# Patient Record
Sex: Male | Born: 1940 | Race: Black or African American | Hispanic: No | Marital: Single | Smoking: Never smoker
Health system: Southern US, Community
[De-identification: ages and names within clinical notes are randomized; demographics above are authoritative.]

## PROBLEM LIST (undated history)

## (undated) DIAGNOSIS — I2510 Atherosclerotic heart disease of native coronary artery without angina pectoris: Secondary | ICD-10-CM

## (undated) DIAGNOSIS — Z8546 Personal history of malignant neoplasm of prostate: Secondary | ICD-10-CM

## (undated) DIAGNOSIS — K219 Gastro-esophageal reflux disease without esophagitis: Secondary | ICD-10-CM

## (undated) DIAGNOSIS — I10 Essential (primary) hypertension: Secondary | ICD-10-CM

## (undated) DIAGNOSIS — G459 Transient cerebral ischemic attack, unspecified: Secondary | ICD-10-CM

## (undated) DIAGNOSIS — R29898 Other symptoms and signs involving the musculoskeletal system: Secondary | ICD-10-CM

## (undated) DIAGNOSIS — E119 Type 2 diabetes mellitus without complications: Secondary | ICD-10-CM

## (undated) DIAGNOSIS — E785 Hyperlipidemia, unspecified: Secondary | ICD-10-CM

## (undated) DIAGNOSIS — C20 Malignant neoplasm of rectum: Secondary | ICD-10-CM

## (undated) DIAGNOSIS — I6529 Occlusion and stenosis of unspecified carotid artery: Secondary | ICD-10-CM

## (undated) DIAGNOSIS — Z972 Presence of dental prosthetic device (complete) (partial): Secondary | ICD-10-CM

## (undated) DIAGNOSIS — J3489 Other specified disorders of nose and nasal sinuses: Secondary | ICD-10-CM

## (undated) DIAGNOSIS — M1990 Unspecified osteoarthritis, unspecified site: Secondary | ICD-10-CM

## (undated) DIAGNOSIS — M419 Scoliosis, unspecified: Secondary | ICD-10-CM

## (undated) HISTORY — PX: PROSTATE BIOPSY: SHX241

## (undated) HISTORY — DX: Gastro-esophageal reflux disease without esophagitis: K21.9

## (undated) HISTORY — DX: Occlusion and stenosis of unspecified carotid artery: I65.29

## (undated) HISTORY — PX: SHOULDER SURGERY: SHX246

## (undated) HISTORY — DX: Unspecified osteoarthritis, unspecified site: M19.90

## (undated) HISTORY — DX: Type 2 diabetes mellitus without complications: E11.9

## (undated) HISTORY — PX: CARDIAC CATHETERIZATION: SHX172

## (undated) HISTORY — DX: Atherosclerotic heart disease of native coronary artery without angina pectoris: I25.10

## (undated) HISTORY — DX: Essential (primary) hypertension: I10

## (undated) HISTORY — PX: INGUINAL HERNIA REPAIR: SUR1180

## (undated) HISTORY — DX: Hyperlipidemia, unspecified: E78.5

## (undated) HISTORY — DX: Malignant neoplasm of rectum: C20

## (undated) HISTORY — DX: Personal history of malignant neoplasm of prostate: Z85.46

---

## 1999-06-14 DIAGNOSIS — C20 Malignant neoplasm of rectum: Secondary | ICD-10-CM

## 1999-06-14 HISTORY — DX: Malignant neoplasm of rectum: C20

## 2003-05-27 ENCOUNTER — Other Ambulatory Visit: Payer: Self-pay

## 2003-06-01 ENCOUNTER — Other Ambulatory Visit: Payer: Self-pay

## 2003-08-14 ENCOUNTER — Other Ambulatory Visit: Payer: Self-pay

## 2004-01-09 ENCOUNTER — Other Ambulatory Visit: Payer: Self-pay

## 2004-05-13 ENCOUNTER — Other Ambulatory Visit: Payer: Self-pay

## 2004-05-13 ENCOUNTER — Emergency Department: Payer: Self-pay | Admitting: Emergency Medicine

## 2004-09-14 ENCOUNTER — Ambulatory Visit: Payer: Self-pay | Admitting: General Surgery

## 2004-11-06 ENCOUNTER — Emergency Department: Payer: Self-pay | Admitting: Internal Medicine

## 2004-11-06 ENCOUNTER — Other Ambulatory Visit: Payer: Self-pay

## 2005-01-24 ENCOUNTER — Emergency Department: Payer: Self-pay | Admitting: Unknown Physician Specialty

## 2005-01-24 ENCOUNTER — Other Ambulatory Visit: Payer: Self-pay

## 2005-03-23 ENCOUNTER — Other Ambulatory Visit: Payer: Self-pay

## 2005-03-23 ENCOUNTER — Emergency Department: Payer: Self-pay | Admitting: Emergency Medicine

## 2005-03-30 ENCOUNTER — Ambulatory Visit: Payer: Self-pay | Admitting: Unknown Physician Specialty

## 2005-10-02 ENCOUNTER — Emergency Department: Payer: Self-pay | Admitting: General Practice

## 2007-06-06 ENCOUNTER — Emergency Department: Payer: Self-pay | Admitting: Emergency Medicine

## 2008-01-07 ENCOUNTER — Emergency Department: Payer: Self-pay | Admitting: Emergency Medicine

## 2008-05-12 ENCOUNTER — Emergency Department: Payer: Self-pay | Admitting: Emergency Medicine

## 2008-11-27 ENCOUNTER — Inpatient Hospital Stay: Payer: Self-pay | Admitting: Internal Medicine

## 2008-12-02 ENCOUNTER — Emergency Department: Payer: Self-pay | Admitting: Emergency Medicine

## 2008-12-07 ENCOUNTER — Emergency Department: Payer: Self-pay | Admitting: Emergency Medicine

## 2009-09-29 ENCOUNTER — Ambulatory Visit: Payer: Self-pay | Admitting: General Surgery

## 2009-09-29 HISTORY — PX: COLONOSCOPY: SHX174

## 2011-01-02 ENCOUNTER — Emergency Department: Payer: Self-pay | Admitting: Emergency Medicine

## 2012-03-26 ENCOUNTER — Encounter: Payer: Self-pay | Admitting: Orthopedic Surgery

## 2012-04-13 ENCOUNTER — Encounter: Payer: Self-pay | Admitting: Orthopedic Surgery

## 2012-06-13 HISTORY — PX: CORONARY ARTERY BYPASS GRAFT: SHX141

## 2012-08-02 ENCOUNTER — Observation Stay: Payer: Self-pay | Admitting: Internal Medicine

## 2012-08-02 LAB — COMPREHENSIVE METABOLIC PANEL
Albumin: 4.2 g/dL (ref 3.4–5.0)
Alkaline Phosphatase: 118 U/L (ref 50–136)
Anion Gap: 9 (ref 7–16)
BUN: 11 mg/dL (ref 7–18)
Bilirubin,Total: 0.6 mg/dL (ref 0.2–1.0)
Calcium, Total: 9 mg/dL (ref 8.5–10.1)
Chloride: 101 mmol/L (ref 98–107)
Co2: 26 mmol/L (ref 21–32)
Creatinine: 0.78 mg/dL (ref 0.60–1.30)
Glucose: 222 mg/dL — ABNORMAL HIGH (ref 65–99)
Osmolality: 278 (ref 275–301)
SGOT(AST): 23 U/L (ref 15–37)
SGPT (ALT): 24 U/L (ref 12–78)

## 2012-08-02 LAB — CBC
HCT: 40.3 % (ref 40.0–52.0)
HGB: 13.4 g/dL (ref 13.0–18.0)
Platelet: 151 10*3/uL (ref 150–440)

## 2012-08-02 LAB — CK TOTAL AND CKMB (NOT AT ARMC)
CK, Total: 135 U/L (ref 35–232)
CK, Total: 114 U/L (ref 35–232)
CK, Total: 112 U/L (ref 35–232)
CK-MB: 0.7 ng/mL (ref 0.5–3.6)
CK-MB: <0.5 ng/mL — ABNORMAL LOW (ref 0.5–3.6)

## 2012-08-02 LAB — LIPID PANEL
HDL Cholesterol: 61 mg/dL — ABNORMAL HIGH (ref 40–60)
Triglycerides: 56 mg/dL (ref 0–200)
VLDL Cholesterol, Calc: 11 mg/dL (ref 5–40)

## 2012-08-02 LAB — TROPONIN I: Troponin-I: <0.02 ng/mL

## 2012-08-02 LAB — HEMOGLOBIN A1C: Hemoglobin A1C: 7.1 % — ABNORMAL HIGH (ref 4.2–6.3)

## 2012-08-03 DIAGNOSIS — I209 Angina pectoris, unspecified: Secondary | ICD-10-CM

## 2012-08-03 LAB — BASIC METABOLIC PANEL
Anion Gap: 6 — ABNORMAL LOW (ref 7–16)
Calcium, Total: 8.8 mg/dL (ref 8.5–10.1)
Chloride: 104 mmol/L (ref 98–107)
Creatinine: 0.82 mg/dL (ref 0.60–1.30)
EGFR (Non-African Amer.): >60
Osmolality: 276 (ref 275–301)
Sodium: 136 mmol/L (ref 136–145)

## 2012-08-03 LAB — CBC WITH DIFFERENTIAL/PLATELET
Eosinophil %: 0.5 %
HCT: 39.5 % — ABNORMAL LOW (ref 40.0–52.0)
Lymphocyte #: 0.9 10*3/uL — ABNORMAL LOW (ref 1.0–3.6)
Lymphocyte %: 23 %
MCHC: 33.2 g/dL (ref 32.0–36.0)
MCV: 93 fL (ref 80–100)
Neutrophil #: 2.6 10*3/uL (ref 1.4–6.5)
Neutrophil %: 64.9 %
Platelet: 160 10*3/uL (ref 150–440)
WBC: 3.9 10*3/uL (ref 3.8–10.6)

## 2012-08-06 ENCOUNTER — Encounter: Payer: Self-pay | Admitting: *Deleted

## 2012-08-06 ENCOUNTER — Telehealth: Payer: Self-pay

## 2012-08-06 NOTE — Telephone Encounter (Signed)
TCM call Pt denies CP, sob or back pain Confirms compliance with meds per d/c instructions Confirms knowledge of appt with [place] 2/26 Feels well, except for "allergy problems" for which he is taking meds for

## 2012-08-06 NOTE — Telephone Encounter (Signed)
TCM  

## 2012-08-06 NOTE — Telephone Encounter (Signed)
Message copied by [facility], Cerita Rabelo E on Mon Aug 06, 2012  9:52 AM ------      Message from: Iverson Alamin C      Created: Mon Aug 06, 2012  9:40 AM       Tcm      [facility] discharge: 08/03/12      Appoint: Brion Aliment 08/08/12      Records printed. ------

## 2012-08-08 ENCOUNTER — Ambulatory Visit (INDEPENDENT_AMBULATORY_CARE_PROVIDER_SITE_OTHER): Payer: Medicare PPO | Admitting: Nurse Practitioner

## 2012-08-08 ENCOUNTER — Encounter: Payer: Self-pay | Admitting: Nurse Practitioner

## 2012-08-08 VITALS — BP 112/70 | HR 83 | Ht 76.5 in | Wt 181.5 lb

## 2012-08-08 DIAGNOSIS — R000 Tachycardia, unspecified: Secondary | ICD-10-CM

## 2012-08-08 DIAGNOSIS — E785 Hyperlipidemia, unspecified: Secondary | ICD-10-CM | POA: Insufficient documentation

## 2012-08-08 DIAGNOSIS — I658 Occlusion and stenosis of other precerebral arteries: Secondary | ICD-10-CM

## 2012-08-08 DIAGNOSIS — I6523 Occlusion and stenosis of bilateral carotid arteries: Secondary | ICD-10-CM

## 2012-08-08 DIAGNOSIS — I6529 Occlusion and stenosis of unspecified carotid artery: Secondary | ICD-10-CM | POA: Insufficient documentation

## 2012-08-08 DIAGNOSIS — I10 Essential (primary) hypertension: Secondary | ICD-10-CM | POA: Insufficient documentation

## 2012-08-08 DIAGNOSIS — E119 Type 2 diabetes mellitus without complications: Secondary | ICD-10-CM

## 2012-08-08 DIAGNOSIS — I2510 Atherosclerotic heart disease of native coronary artery without angina pectoris: Secondary | ICD-10-CM

## 2012-08-08 NOTE — Progress Notes (Signed)
Patient Name: Jeremy Braun Date of Encounter: 08/08/2012  Primary Care Provider:  Yetta Flock, MD Primary Cardiologist:  Concha Se, MD  Patient Profile  72 year old male with prior history of CAD who was recently seen in the hospital for chest pain and had a negative Myoview presents for followup.  Problem List   Past Medical History  Diagnosis Date  . Coronary artery disease     a. s/p CABG;  b. 07/2012 Myoview sm region of mildly dec perfusion in the basal to mid inf wall w/o signif ischemia.  EF 60%.  . Hypertension   . Hyperlipidemia   . DM II (diabetes mellitus, type II), controlled   . History of prostate cancer   . Carotid stenosis     a. 07/2012 U/S: < 50% bilat stenosis - Followed by Dr. Wyn Quaker.   Past Surgical History  Procedure Laterality Date  . Shoulder surgery Left   . Inguinal hernia repair    . Coronary artery bypass graft      [facility]  . Prostate biopsy      Allergies  No Known Allergies  HPI  72 year old male with the above problem list. He was recently admitted to [facility] with complaints of chest discomfort. CT angiography chest was negative for dissection. He ruled out for MI. He was seen by cardiology and underwent Myoview which was negative. He was subsequently discharged home. Since discharge, he has had no chest pain, dyspnea, PND, orthopnea, syncope, or edema. He did feel a little dizzy after awakening and standing this morning but this resolved by sitting down. He does not routinely check his blood pressure at home. Is not routinely exercising.  Home Medications  Prior to Admission medications   Medication Sig Start Date End Date Taking? Authorizing Provider  amLODipine (NORVASC) 10 MG tablet Take 10 mg by mouth daily.   Yes Historical Provider, MD  aspirin 81 MG tablet Take 81 mg by mouth daily.   Yes Historical Provider, MD  benazepril (LOTENSIN) 40 MG tablet Take 40 mg by mouth 2 (two) times daily.   Yes Historical Provider, MD    clopidogrel (PLAVIX) 75 MG tablet Take 75 mg by mouth daily.   Yes Historical Provider, MD  Cyanocobalamin (VITAMIN B-12 IJ) Inject as directed every 30 (thirty) days.   Yes Historical Provider, MD  fluticasone (FLONASE) 50 MCG/ACT nasal spray Place 2 sprays into the nose daily.  06/28/12  Yes Historical Provider, MD  glipiZIDE (GLUCOTROL) 10 MG tablet Take 10 mg by mouth 2 (two) times daily before a meal.   Yes Historical Provider, MD  insulin glargine (LANTUS) 100 UNIT/ML injection Inject 11 Units into the skin at bedtime.   Yes Historical Provider, MD  isosorbide mononitrate (IMDUR) 30 MG 24 hr tablet Take 30 mg by mouth daily.   Yes Historical Provider, MD  lovastatin (MEVACOR) 40 MG tablet Take 40 mg by mouth at bedtime.   Yes Historical Provider, MD  metFORMIN (GLUCOPHAGE) 1000 MG tablet Takes 1000 mg am and 1500 mg pm daily.   Yes Historical Provider, MD  pioglitazone (ACTOS) 45 MG tablet Take 45 mg by mouth daily.   Yes Historical Provider, MD    Review of Systems  As above, he had some orthostasis this morning but otherwise has been doing well.  He denies chest pain, palpitations, dyspnea, pnd, orthopnea, n, v, dizziness, syncope, edema, weight gain, or early satiety. All other systems reviewed and are otherwise negative except as noted above.  Physical Exam  Blood pressure 112/70, pulse 83, height 6' 4.5" (1.943 m), weight 181 lb 8 oz (82.328 kg).  General: Pleasant, NAD Psych: Normal affect. Neuro: Alert and oriented X 3. Moves all extremities spontaneously. HEENT: Normal  Neck: Supple without bruits or JVD. Lungs:  Resp regular and unlabored, CTA. Heart: RRR no s3, s4, or murmurs. Abdomen: Soft, non-tender, non-distended, BS + x 4.  Extremities: No clubbing, cyanosis or edema. DP/PT/Radials 2+ and equal bilaterally.  Accessory Clinical Findings  ECG - regular sinus rhythm, 81, no acute ST or T changes.  Assessment & Plan  1.  Chest pain/coronary artery disease: Status  post recent admission and rule out. Myoview was negative during admission and he has had no recurrence of chest pain. He remains on aspirin, statin, and long-acting nitrate therapy. He is also on chronic Plavix.  Nitrate was initiated during hospitalization and prior to stress testing. Patient wishes to stop this if not necessary. In the absence of ischemia I recommended he could discontinue and see how he feels.  2. Hypertension: Stable. He reports signs of mild orthostasis this morning. I recommended he change positions more slowly prior to getting up.  3. Hyperlipidemia: Continue statin therapy.  4. Diabetes mellitus: Per primary care.  5. Carotid arterial disease: Status post recent ultrasound with less than 50% bilateral stenosis. This is been followed by vascular surgery in the past.  6. Disposition: Followup in 3-6 months.    Nicolasa Ducking, NP 08/08/2012, 4:32 PM

## 2012-08-08 NOTE — Patient Instructions (Addendum)
No changes to your medications were made today.  Continue medications  F/u with Dr. Mariah Milling in 3 months.

## 2012-09-19 ENCOUNTER — Ambulatory Visit: Payer: Self-pay | Admitting: Ophthalmology

## 2012-10-19 ENCOUNTER — Emergency Department: Payer: Self-pay | Admitting: Unknown Physician Specialty

## 2012-10-19 LAB — CBC
HGB: 13 g/dL (ref 13.0–18.0)
MCH: 30.5 pg (ref 26.0–34.0)
MCHC: 33.4 g/dL (ref 32.0–36.0)
MCV: 91 fL (ref 80–100)
Platelet: 133 10*3/uL — ABNORMAL LOW (ref 150–440)
RDW: 12.3 % (ref 11.5–14.5)

## 2012-10-19 LAB — COMPREHENSIVE METABOLIC PANEL
Alkaline Phosphatase: 113 U/L (ref 50–136)
Anion Gap: 6 — ABNORMAL LOW (ref 7–16)
BUN: 14 mg/dL (ref 7–18)
Bilirubin,Total: 0.6 mg/dL (ref 0.2–1.0)
Calcium, Total: 9.1 mg/dL (ref 8.5–10.1)
Chloride: 101 mmol/L (ref 98–107)
Co2: 27 mmol/L (ref 21–32)
Creatinine: 0.96 mg/dL (ref 0.60–1.30)
EGFR (Non-African Amer.): >60
Glucose: 214 mg/dL — ABNORMAL HIGH (ref 65–99)
SGOT(AST): 23 U/L (ref 15–37)
SGPT (ALT): 20 U/L (ref 12–78)

## 2012-10-19 LAB — TROPONIN I: Troponin-I: <0.02 ng/mL

## 2012-11-06 ENCOUNTER — Ambulatory Visit: Payer: Self-pay | Admitting: Cardiovascular Disease

## 2012-12-21 ENCOUNTER — Encounter: Payer: Self-pay | Admitting: *Deleted

## 2013-06-13 HISTORY — PX: EYE SURGERY: SHX253

## 2014-07-02 DIAGNOSIS — E538 Deficiency of other specified B group vitamins: Secondary | ICD-10-CM | POA: Diagnosis not present

## 2014-07-15 DIAGNOSIS — I10 Essential (primary) hypertension: Secondary | ICD-10-CM | POA: Diagnosis not present

## 2014-07-15 DIAGNOSIS — D510 Vitamin B12 deficiency anemia due to intrinsic factor deficiency: Secondary | ICD-10-CM | POA: Diagnosis not present

## 2014-07-15 DIAGNOSIS — E780 Pure hypercholesterolemia: Secondary | ICD-10-CM | POA: Diagnosis not present

## 2014-07-15 DIAGNOSIS — E538 Deficiency of other specified B group vitamins: Secondary | ICD-10-CM | POA: Diagnosis not present

## 2014-07-15 DIAGNOSIS — E119 Type 2 diabetes mellitus without complications: Secondary | ICD-10-CM | POA: Diagnosis not present

## 2014-07-22 DIAGNOSIS — E119 Type 2 diabetes mellitus without complications: Secondary | ICD-10-CM | POA: Diagnosis not present

## 2014-07-22 DIAGNOSIS — I10 Essential (primary) hypertension: Secondary | ICD-10-CM | POA: Diagnosis not present

## 2014-07-22 DIAGNOSIS — E784 Other hyperlipidemia: Secondary | ICD-10-CM | POA: Diagnosis not present

## 2014-07-22 DIAGNOSIS — I2510 Atherosclerotic heart disease of native coronary artery without angina pectoris: Secondary | ICD-10-CM | POA: Diagnosis not present

## 2014-08-04 DIAGNOSIS — E538 Deficiency of other specified B group vitamins: Secondary | ICD-10-CM | POA: Diagnosis not present

## 2014-08-15 DIAGNOSIS — E11329 Type 2 diabetes mellitus with mild nonproliferative diabetic retinopathy without macular edema: Secondary | ICD-10-CM | POA: Diagnosis not present

## 2014-09-04 DIAGNOSIS — D510 Vitamin B12 deficiency anemia due to intrinsic factor deficiency: Secondary | ICD-10-CM | POA: Diagnosis not present

## 2014-09-19 DIAGNOSIS — H02403 Unspecified ptosis of bilateral eyelids: Secondary | ICD-10-CM | POA: Diagnosis not present

## 2014-09-25 DIAGNOSIS — H02403 Unspecified ptosis of bilateral eyelids: Secondary | ICD-10-CM | POA: Diagnosis not present

## 2014-09-30 ENCOUNTER — Encounter: Payer: Self-pay | Admitting: General Surgery

## 2014-09-30 ENCOUNTER — Ambulatory Visit (INDEPENDENT_AMBULATORY_CARE_PROVIDER_SITE_OTHER): Payer: Commercial Managed Care - HMO | Admitting: General Surgery

## 2014-09-30 VITALS — BP 122/74 | HR 70 | Resp 12 | Ht 77.0 in | Wt 167.0 lb

## 2014-09-30 DIAGNOSIS — Z1211 Encounter for screening for malignant neoplasm of colon: Secondary | ICD-10-CM | POA: Insufficient documentation

## 2014-09-30 NOTE — Patient Instructions (Signed)
Colonoscopy  A colonoscopy is an exam to look at the entire large intestine (colon). This exam can help find problems such as tumors, polyps, inflammation, and areas of bleeding. The exam takes about 1 hour.   LET YOUR HEALTH CARE PROVIDER KNOW ABOUT:   · Any allergies you have.  · All medicines you are taking, including vitamins, herbs, eye drops, creams, and over-the-counter medicines.  · Previous problems you or members of your family have had with the use of anesthetics.  · Any blood disorders you have.  · Previous surgeries you have had.  · Medical conditions you have.  RISKS AND COMPLICATIONS   Generally, this is a safe procedure. However, as with any procedure, complications can occur. Possible complications include:  · Bleeding.  · Tearing or rupture of the colon wall.  · Reaction to medicines given during the exam.  · Infection (rare).  BEFORE THE PROCEDURE   · Ask your health care provider about changing or stopping your regular medicines.  · You may be prescribed an oral bowel prep. This involves drinking a large amount of medicated liquid, starting the day before your procedure. The liquid will cause you to have multiple loose stools until your stool is almost clear or light green. This cleans out your colon in preparation for the procedure.  · Do not eat or drink anything else once you have started the bowel prep, unless your health care provider tells you it is safe to do so.  · Arrange for someone to drive you home after the procedure.  PROCEDURE   · You will be given medicine to help you relax (sedative).  · You will lie on your side with your knees bent.  · A long, flexible tube with a light and camera on the end (colonoscope) will be inserted through the rectum and into the colon. The camera sends video back to a computer screen as it moves through the colon. The colonoscope also releases carbon dioxide gas to inflate the colon. This helps your health care provider see the area better.  · During  the exam, your health care provider may take a small tissue sample (biopsy) to be examined under a microscope if any abnormalities are found.  · The exam is finished when the entire colon has been viewed.  AFTER THE PROCEDURE   · Do not drive for 24 hours after the exam.  · You may have a small amount of blood in your stool.  · You may pass moderate amounts of gas and have mild abdominal cramping or bloating. This is caused by the gas used to inflate your colon during the exam.  · Ask when your test results will be ready and how you will get your results. Make sure you get your test results.  Document Released: 05/27/2000 Document Revised: 03/20/2013 Document Reviewed: 02/04/2013  Exit[employer]® Patient Information ©2015 [employer]. This information is not intended to replace advice given to you by your health care provider. Make sure you discuss any questions you have with your health care provider.

## 2014-09-30 NOTE — Progress Notes (Signed)
Patient ID: Jeremy Braun, male   DOB: Oct 14, 1940, 74 y.o.   MRN: [id number]  Chief Complaint  Patient presents with  . Other    colonoscopy    HPI Jeremy Braun is a 74 y.o. male here today for a evaluation of a colonoscopy. Patient states no Gi problems at this time. Last colonoscopy was performed on 09/29/2009.  The patient had transanal excision of rectal polyp in 2001. HPI  Past Medical History  Diagnosis Date  . Coronary artery disease     a. s/p CABG;  b. 07/2012 Myoview sm region of mildly dec perfusion in the basal to mid inf wall w/o signif ischemia.  EF 60%.  . Hypertension   . Hyperlipidemia   . DM II (diabetes mellitus, type II), controlled   . History of prostate cancer   . Carotid stenosis     a. 07/2012 U/S: < 50% bilat stenosis - Followed by Dr. Lucky Braun.  . Arthritis   . GERD (gastroesophageal reflux disease)   . Rectal cancer 2001    Local resection.    Past Surgical History  Procedure Laterality Date  . Shoulder surgery Left   . Inguinal hernia repair    . Prostate biopsy    . Colonoscopy  09/29/2009  . Eye surgery Right 2015    Inplant, Dr Jeremy Braun  . Coronary artery bypass graft  2014    [facility]    Family History  Problem Relation Age of Onset  . Heart disease Mother   . Heart disease Brother     Social History History  Substance Use Topics  . Smoking status: Never Smoker   . Smokeless tobacco: Not on file  . Alcohol Use: No    No Known Allergies  Current Outpatient Prescriptions  Medication Sig Dispense Refill  . amLODipine (NORVASC) 10 MG tablet Take 10 mg by mouth daily.    Marland Kitchen aspirin 81 MG tablet Take 81 mg by mouth daily.    . benazepril (LOTENSIN) 40 MG tablet Take 40 mg by mouth 2 (two) times daily.    . clopidogrel (PLAVIX) 75 MG tablet Take 75 mg by mouth daily.    . Cyanocobalamin (VITAMIN B-12 IJ) Inject as directed every 30 (thirty) days.    . Cyanocobalamin (VITAMIN B-12) 5000 MCG LOZG Take 1 tablet by mouth daily.    .  finasteride (PROSCAR) 5 MG tablet Take 5 mg by mouth daily.    Marland Kitchen glipiZIDE (GLUCOTROL) 10 MG tablet Take 10 mg by mouth 2 (two) times daily before a meal.    . insulin glargine (LANTUS) 100 UNIT/ML injection Inject 11 Units into the skin at bedtime.    . isosorbide mononitrate (IMDUR) 30 MG 24 hr tablet Take 30 mg by mouth daily.    Marland Kitchen lovastatin (MEVACOR) 40 MG tablet Take 40 mg by mouth at bedtime.    . meloxicam (MOBIC) 7.5 MG tablet Take by mouth.    . metFORMIN (GLUCOPHAGE) 1000 MG tablet Takes 1000 mg am and 1500 mg pm daily.    . pioglitazone (ACTOS) 45 MG tablet Take 45 mg by mouth daily.    . Vitamin D, Cholecalciferol, 1000 UNITS CAPS Take 1 capsule by mouth daily.     No current facility-administered medications for this visit.    Review of Systems Review of Systems  Constitutional: Negative.   Respiratory: Negative.   Cardiovascular: Negative.   Gastrointestinal: Negative.     Blood pressure 122/74, pulse 70, resp. rate 12, height 6\' 5"  (  1.956 m), weight 167 lb (75.751 kg).  Physical Exam Physical Exam  Constitutional: He is oriented to person, place, and time. He appears well-developed and well-nourished.  Eyes: Conjunctivae are normal. No scleral icterus.  Neck: Neck supple.  Cardiovascular: Normal rate, regular rhythm and normal heart sounds.   Pulmonary/Chest: Effort normal and breath sounds normal.  Lymphadenopathy:    He has no cervical adenopathy.  Neurological: He is alert and oriented to person, place, and time.  Skin: Skin is warm and dry.    Data Reviewed 2011 colonoscopy was normal.  Laboratory studies dated 07/15/2014 showed a hemoglobin A1c of 7.1.  Creatinine 0.9 with an estimated GFR 100. Normal electrolytes. Normal liver function studies.  CBC of the same date showed a hemoglobin of 13.2 with an MCV of 94, white blood cell count 4600. Platelet count 175,000.    Assessment    Past history: malignant polyp of the rectum.  Insulin-dependent  diabetes mellitus. Recently well controlled.  Previous TIA/CABG 2014.    Plan    Colonoscopy with possible biopsy/polypectomy prn: Information regarding the procedure, including its potential risks and complications (including but not limited to perforation of the bowel, which may require emergency surgery to repair, and bleeding) was verbally given to the patient. Educational information regarding lower instestinal endoscopy was given to the patient. Written instructions for how to complete the bowel prep using Miralax were provided. The importance of drinking ample fluids to avoid dehydration as a result of the prep emphasized.   The patient will need to discontinue his Plavix 1 week prior to the procedure. We will have him hold his metformin and Glucotrol prior to the procedure. He will continue on his present Lantus and Actos doses.      PCP: Dr Jeremy Braun   Jeremy Braun 09/30/2014, 8:49 PM

## 2014-10-01 ENCOUNTER — Telehealth: Payer: Self-pay | Admitting: *Deleted

## 2014-10-01 DIAGNOSIS — Z1211 Encounter for screening for malignant neoplasm of colon: Secondary | ICD-10-CM

## 2014-10-01 MED ORDER — POLYETHYLENE GLYCOL 3350 17 GM/SCOOP PO POWD: ORAL | Status: AC

## 2014-10-01 NOTE — Telephone Encounter (Signed)
-----   Message from [name], MD sent at 09/30/2014  8:54 PM EDT ----- The patient should hold his glipizide and metformin the day of prep and procedure. He should take his normal dose of Actos and Lantus. Hold Plavix for 1 week.

## 2014-10-01 NOTE — Telephone Encounter (Signed)
Patient has been scheduled for a colonoscopy on 11-26-14 at [facility].   Miralax prescription sent to patient's pharmacy.   Patient will stop by this afternoon to go over instructions since he will be in the area.

## 2014-10-03 DIAGNOSIS — H6982 Other specified disorders of Eustachian tube, left ear: Secondary | ICD-10-CM | POA: Diagnosis not present

## 2014-10-03 NOTE — H&P (Signed)
PATIENT NAME:  Jeremy Braun, Jeremy Braun MR#:  [id number] DATE OF BIRTH:  08-Sep-1940  DATE OF ADMISSION:  08/02/2012  ADMITTING PHYSICIAN: Gladstone Lighter, MD  PRIMARY CARE PHYSICIAN: Kem Kays, MD  CHIEF COMPLAINT: Back pain.   HISTORY OF PRESENT ILLNESS: Mr. Ram is a 74 year old African American male with past medical history significant for coronary artery disease status post bypass graft surgery, diabetes and hypertension who presents to the hospital with the above-mentioned complaint. The patient said he woke up early yesterday morning with pain between his shoulder blades and radiating to his chest and also felt some chest tightness which almost stayed for more than an hour. It was not associated with any nausea, difficulty breathing or chest pain. His bypass surgery was more than 20 years ago and has been doing fine cardiac point wise.  Has not had a stress test in the past 5 years. He did have a CT of chest which did not show any acute findings like PE or dissection so he is being admitted for anginal equivalent and also to rule out acute coronary syndrome.   PAST MEDICAL HISTORY: 1.  Coronary artery disease status post bypass graft surgery about 20 years ago at [facility].   2.  Hypertension.  3.  Hyperlipidemia. 4.  Insulin-dependent diabetes mellitus.   PAST SURGICAL HISTORY: 1.  Coronary artery bypass graft surgery. 2.  Left shoulder repair. 3.  Inguinal hernia repair.   ALLERGIES TO MEDICATIONS: No known drug allergies.   CURRENT HOME MEDICATIONS:  1.   Actos 45 mg p.o. daily. 2.   Norvasc 10 mg p.o. daily. 3.   Aspirin 81 mg p.o. daily.  4.   Benazepril 20 mg p.o. daily. 5.   Glipizide 10 mg p.o. b.i.d.   6.   Lantus 8 units sub-Q daily.  7.   Lovastatin 40 mg p.o. daily.  8.   Metformin 1000 mg in the morning and 1500 mg in the evening. 9.   Plavix 75 mg p.o. daily.   SOCIAL HISTORY: Lives at home with his friend. No history of any current or prior smoking. No alcohol  use.   FAMILY HISTORY: Brother with heart disease and mom with heart disease and bypass graft surgery.  REVIEW OF SYSTEMS: CONSTITUTIONAL: No fever, fatigue or weakness.  EYES: Positive for blurred vision and premature cataracts. No glaucoma or inflammation.  ENT: No tinnitus, ear pain, epistaxis or discharge.  RESPIRATORY: Positive trach cuff for more than a month. No wheeze or COPD.  CARDIOVASCULAR:  Positive for some chest pressure.  No orthopnea, edema, palpitations or syncope. GENITOURINARY: No nausea, vomiting, diarrhea, abdominal pain, hematemesis or melena.  GENITOURINARY:  No dysuria, hematuria, renal calculus, frequency or incontinence.  ENDOCRINE:  No polyuria, nocturia, thyroid problems, heat or cold intolerance. HEMATOLOGIC:  No anemia, easy bruising or bleeding. SKIN: No acne, rash or lesions. MUSCULOSKELETAL: Positive for pain between upper shoulder blades. No arthritis or gout.  NEUROLOGIC:  No numbness, weakness, CVA, TIA or seizures.  PSYCH: No anxiety, insomnia or depression.  PHYSICAL EXAMINATION:  VITAL SIGNS: Temperature 97.8 degrees Fahrenheit, pulse 101, respirations 22, blood pressure 240/101, pulse ox 99% on room air. GENERAL: Well-built, well-nourished male lying in bed, not in any acute distress. HEENT:  Normocephalic, atraumatic. Pupils equal, round and reacting to light. Anicteric sclerae. Extraocular movements intact.  Oropharynx clear without erythema, mass or exudates.  NECK:  Supple. No thyromegaly, JVD or carotid bruits. No lymphadenopathy. LUNGS:  Moving air bilaterally. No wheeze or crackles. No  use of accessory muscles for breathing.  CARDIOVASCULAR: S1 and S2 regular rate and rhythm. 3/6 systolic murmur present in the precordial area. No rubs or gallops. ABDOMEN:  Soft and nontender. Nondistended. No hepatosplenomegaly. Normal bowel sounds. EXTREMITIES: No pedal edema. No clubbing or cyanosis. 2+ dorsalis pedis pulses palpable bilaterally.  SKIN: No  acne, rash or lesions.  LYMPHATICS: No cervical lymphadenopathy.  NEUROLOGIC: Cranial nerves intact. No focal motor or sensory deficits.  PSYCH: The patient is awake, alert and oriented x 3.   LAB DATA: WBC 4.3, hemoglobin 13.4, hematocrit 40.3, platelet count 151.   Sodium 136, potassium 3.8, chloride 101, bicarbonate 26, BUN 11, creatinine 0.79, glucose 222 and calcium of 9.0.   ALT 24, AST 23, alk phos 118, total bilirubin 0.6 and albumin of 4.2. First set of cardiac enzymes are negative. CK 135, CK-MB 0.7, troponin less than 0.02. Second set: CK 114, CK-MB less than 0.5, troponin less than 0.02. HbA1c elevated at 7.1. LDL 66, triglycerides 56, total cholesterol 138 with HDL of 61.  DIAGNOSTICS:  Chest x-ray on admission showing clear lungs. No acute cardiopulmonary disease. CT of the chest with contrast showing dilatation of the aorta proximally without any dissection. Coronary artery atherosclerotic disease is present with postoperative changes. No thoracic aortic dissection or pulmonary embolic filling defect is present. EKG: Normal sinus rhythm. Heart rate of 81.    ASSESSMENT AND PLAN: A 74 year old male with past medical history of hypertension, diabetes and coronary artery disease admitted for back pain and possible angina equivalent. 1.  Anginal equivalent.  Admit to telemetry under observation. Cycle cardiac enzymes. Will get Myoview in the morning. Start on aspirin, nitroglycerin and other medical management.  2.  Malignant hypertension/accelerated hypertension. Start on IV hydralazine p.r.n. and also continue his home medications.  3.  Diabetes mellitus. Slightly elevated HbA1c.  Continue sliding scale insulin and also home medications.  4.   Hyperlipemia. On statin.  5.  History of carotid artery stenosis.  Not sure which side he had blockage, so we will get carotid artery Dopplers while he is in the hospital.  6.  GI and DVT prophylaxis with ranitidine and also Lovenox.   CODE  STATUS: FULL CODE.   TIME SPENT ON ADMISSION: 50 minutes. ____________________________ Gladstone Lighter, MD rk:sb D: 08/02/2012 14:22:28 ET T: 08/02/2012 14:49:51 ET JOB#: [id number]  cc: Gladstone Lighter, MD, <Dictator> Lorenza Evangelist, MD Gladstone Lighter MD ELECTRONICALLY SIGNED 08/08/2012 16:00

## 2014-10-03 NOTE — Discharge Summary (Signed)
PATIENT NAME:  DANI, DANIS MR#:  [id number] DATE OF BIRTH:  [birth year]  DATE OF ADMISSION:  08/02/2012 DATE OF DISCHARGE:  08/03/2012  ADMITTING DIAGNOSIS: Back pain concerning for possible angina.  DISCHARGE DIAGNOSES: 1. Unstable angina with chest as well as back pain. 2. Hypertension. 3. Hyperlipidemia with LDL well controlled at 66.  4. Diabetes mellitus, insulin-dependent, hemoglobin A1c is 7.1.   DISCHARGE CONDITION: Stable.   DISCHARGE MEDICATIONS: The patient is to resume his outpatient medications, which are:  1. Amlodipine 10 mg p.o. daily. 2. Plavix 75 mg p.o. daily. 3. Actos 45 mg p.o. daily. 4. Aspirin 81 mg p.o. daily. 5. Lovastatin 40 p.o. daily.  6. Benazepril 40 mg p.o. twice daily. This is advancement of the patient's dose.  7.  Isosorbide mononitrate 30 mg p.o. daily. This is a new medication.  8. Metformin 1 gram 1 tablet in the morning and 1.5 tablets in the evening to be started on Sunday, which is 08/05/2012.  9. Insulin glargine 11 units subcutaneously at bedtime. This is new dose, up from 8 units.  10. The patient is also to continue her glipizide 10 mg p.o. twice daily dose.   HOME OXYGEN: None.   DIET: 2 grams salt, low fat, low cholesterol, carbohydrate diet, regular consistency.   ACTIVITY LIMITATIONS: As tolerated.   FOLLOWUP APPOINTMENT: With Dr. Kem Kays in 2 days after discharge and Dr. Rockey Situ in one week after discharge.   CONSULTANTS: Care Management, Dr. Rockey Situ.   RADIOLOGIC STUDIES: Chest PA and lateral 08/02/2012, showed no acute cardiopulmonary disease. CT scan of the chest with contrast 08/02/2012, revealed that the rotation of the aorta proximally without dissection. Coronary artery atherosclerotic disease was present with postoperative changes. There is no thoracic aortic dissection or pulmonary embolic filling defect. Carotid ultrasound, bilateral, 08/02/2012, revealed no sonographic evidence of hemodynamically significant  stenosis within the right or left carotid systems. Myoview stress test 08/03/2012, revealed pharmacological myocardial perfusion imaging study with no significant ischemia, a small region of mildly decreased perfusion in the basal to mid inferior wall seen on both stress and rest images, unable to exclude a small region of old infarct, a small region of the wall motion abnormality noted in this area. Normal ejection fraction estimated at 60%. No significant EKG changes concerning for ischemia. Overall this is a low-risk scan according to cardiologist.  HOSPITAL COURSE: The patient is a 74 year old male with past medical history significant for a history of hypertension, coronary artery disease, hyperlipidemia, as well as diabetes mellitus, who presented to the hospital with complaints of back pain. Please refer to Dr. Michelene Heady admission note done 08/02/2012. Apparently, he was complaining about back pain as well as anterior chest tightness, lasting for  approximately one hour. On arrival to the Emergency Room, the patient's temperature was 97.8, the pulse was 101, respiration rate was 22, blood pressure 240/101, pulse oximetry was 99% on room air. Physical exam was unremarkable. The patient's EKG showed normal sinus rhythm at 81 beats, no acute ST changes were noted. The patient's lab data done in the Emergency Room initially on the 08/02/2012, showed glucose of 222, otherwise BMP was unremarkable. The patient's liver enzymes were normal. Cardiac enzymes x3 were within normal limits. The patient's CBC showed a white blood cell count of 4.3, hemoglobin was 13.4, platelet count 151. The patient's chest x-ray was unremarkable. The patient was admitted to the hospital for further evaluation. His cardiac enzymes were cycled, and the consultation with cardiologist was obtained. The patient  also was recommended cardiac stress test. A stress test was performed on 08/03/2012 and it was negative for inducible ischemia.  It was felt that the patient has a low-risk scan; however, it was also felt that the patient is at risk of worsening condition and if recurrent pains occur, the patient may benefit from a cardiac catheterization. For this reason, the patient is being discharged home with the above-mentioned medications adding isosorbide mononitrate as well as followup with cardiologist, Dr. Rockey Situ, in the next few days after discharge.   In regards to poorly controled hypertension, the patient's blood pressure medications were advanced to current levels, and the patient's blood pressure was much better controlled. By the day of discharge, 08/03/2012, the patient's temperature was 98.4, pulse of 70, respiratory rate was 16, blood pressure ranging from 152 to 166, oxygen saturation was 97% on room air at rest. It was felt that the patient should have very close follow up with Cardiology, as well as his primary care physician, Dr. Kem Kays, and make decisions about advancement of his blood pressure medications if needed.   For hyperlipidemia, the patient's lipid panel was checked and the patient's LDL was found to be well controlled at 66. The patient's total cholesterol level was 138, triglycerides were 56 and HDL was 61. It was felt that the patient's lipids are well controlled. The patient is to continue his outpatient medication doses.   For diabetes mellitus, the patient's hemoglobin A1c was checked and it was found to be 7.1. While in the hospital, the patient was noted to significant morning hyperglycemia with blood glucose levels at around 170s. Lantus was advanced. The patient is to continue all other diabetic medications. It is recommended to advance the patient's medications even more if needed.   By the day of discharge, the patient notes at this time he did not complain of any significant discomfort. He is being discharged in stable condition with the above-mentioned medications and followup. He is to resume  his outpatient medications. He had his blood pressure medications  advanced. He is to hold metformin until Sunday, 08/05/2012, since he received IV dye for a CT scan of his chest. He is, however, to resume all other diabetic medications and his Lantus was slightly advanced.  For hyperlipidemia, the patient is to continue lovastatin as well as low fat, low cholesterol diet. The patient is being discharged in stable condition with the above-mentioned medications and followup.   TIME SPENT:  40 minutes.  ____________________________ Theodoro Grist, MD rv:jm D: 08/03/2012 18:49:39 ET T: 08/04/2012 11:22:44 ET JOB#: [id number]  cc: Theodoro Grist, MD, <Dictator> Lorenza Evangelist, MD Minna Merritts, MD  [facility] MD ELECTRONICALLY SIGNED 09/04/2012 15:18

## 2014-10-07 DIAGNOSIS — D510 Vitamin B12 deficiency anemia due to intrinsic factor deficiency: Secondary | ICD-10-CM | POA: Diagnosis not present

## 2014-11-07 DIAGNOSIS — E538 Deficiency of other specified B group vitamins: Secondary | ICD-10-CM | POA: Diagnosis not present

## 2014-11-12 DIAGNOSIS — E119 Type 2 diabetes mellitus without complications: Secondary | ICD-10-CM | POA: Diagnosis not present

## 2014-11-12 DIAGNOSIS — E785 Hyperlipidemia, unspecified: Secondary | ICD-10-CM | POA: Diagnosis not present

## 2014-11-12 DIAGNOSIS — I10 Essential (primary) hypertension: Secondary | ICD-10-CM | POA: Diagnosis not present

## 2014-11-12 DIAGNOSIS — I6529 Occlusion and stenosis of unspecified carotid artery: Secondary | ICD-10-CM | POA: Diagnosis not present

## 2014-11-14 DIAGNOSIS — I10 Essential (primary) hypertension: Secondary | ICD-10-CM | POA: Diagnosis not present

## 2014-11-14 DIAGNOSIS — I2510 Atherosclerotic heart disease of native coronary artery without angina pectoris: Secondary | ICD-10-CM | POA: Diagnosis not present

## 2014-11-14 DIAGNOSIS — E119 Type 2 diabetes mellitus without complications: Secondary | ICD-10-CM | POA: Diagnosis not present

## 2014-11-14 DIAGNOSIS — E784 Other hyperlipidemia: Secondary | ICD-10-CM | POA: Diagnosis not present

## 2014-11-19 ENCOUNTER — Encounter: Payer: Self-pay | Admitting: *Deleted

## 2014-11-19 ENCOUNTER — Encounter: Payer: Self-pay | Admitting: Student in an Organized Health Care Education/Training Program

## 2014-11-19 DIAGNOSIS — E119 Type 2 diabetes mellitus without complications: Secondary | ICD-10-CM | POA: Diagnosis not present

## 2014-11-19 DIAGNOSIS — E780 Pure hypercholesterolemia: Secondary | ICD-10-CM | POA: Diagnosis not present

## 2014-11-19 DIAGNOSIS — I10 Essential (primary) hypertension: Secondary | ICD-10-CM | POA: Diagnosis not present

## 2014-11-19 DIAGNOSIS — H04123 Dry eye syndrome of bilateral lacrimal glands: Secondary | ICD-10-CM | POA: Diagnosis not present

## 2014-11-21 ENCOUNTER — Other Ambulatory Visit: Payer: Self-pay | Admitting: General Surgery

## 2014-11-21 DIAGNOSIS — Z85048 Personal history of other malignant neoplasm of rectum, rectosigmoid junction, and anus: Principal | ICD-10-CM

## 2014-11-21 DIAGNOSIS — Z08 Encounter for follow-up examination after completed treatment for malignant neoplasm: Secondary | ICD-10-CM

## 2014-11-21 NOTE — H&P (Signed)
Patient ID: Jeremy Braun, male DOB: [birth year], 74 y.o. MRN: [id number]  Chief Complaint  Patient presents with  . Other    colonoscopy    HPI Jeremy Braun is a 74 y.o. male here today for a evaluation of a colonoscopy. Patient states no Gi problems at this time. Last colonoscopy was performed on 09/29/2009.  The patient had transanal excision of rectal polyp in 2001. HPI  Past Medical History  Diagnosis Date  . Coronary artery disease     a. s/p CABG; b. 07/2012 Myoview sm region of mildly dec perfusion in the basal to mid inf wall w/o signif ischemia. EF 60%.  . Hypertension   . Hyperlipidemia   . DM II (diabetes mellitus, type II), controlled   . History of prostate cancer   . Carotid stenosis     a. 07/2012 U/S: < 50% bilat stenosis - Followed by Dr. Lucky Cowboy.  . Arthritis   . GERD (gastroesophageal reflux disease)   . Rectal cancer 2001    Local resection.    Past Surgical History  Procedure Laterality Date  . Shoulder surgery Left   . Inguinal hernia repair    . Prostate biopsy    . Colonoscopy  09/29/2009  . Eye surgery Right 2015    Inplant, Dr Lita Mains  . Coronary artery bypass graft  2014    [facility]    Family History  Problem Relation Age of Onset  . Heart disease Mother   . Heart disease Brother     Social History History  Substance Use Topics  . Smoking status: Never Smoker   . Smokeless tobacco: Not on file  . Alcohol Use: No    No Known Allergies  Current Outpatient Prescriptions  Medication Sig Dispense Refill  . amLODipine (NORVASC) 10 MG tablet Take 10 mg by mouth daily.    Marland Kitchen aspirin 81 MG tablet Take 81 mg by mouth daily.    . benazepril (LOTENSIN) 40 MG tablet Take 40 mg by mouth 2 (two) times daily.    . clopidogrel (PLAVIX) 75 MG tablet Take 75 mg by mouth daily.    . Cyanocobalamin (VITAMIN  B-12 IJ) Inject as directed every 30 (thirty) days.    . Cyanocobalamin (VITAMIN B-12) 5000 MCG LOZG Take 1 tablet by mouth daily.    . finasteride (PROSCAR) 5 MG tablet Take 5 mg by mouth daily.    Marland Kitchen glipiZIDE (GLUCOTROL) 10 MG tablet Take 10 mg by mouth 2 (two) times daily before a meal.    . insulin glargine (LANTUS) 100 UNIT/ML injection Inject 11 Units into the skin at bedtime.    . isosorbide mononitrate (IMDUR) 30 MG 24 hr tablet Take 30 mg by mouth daily.    Marland Kitchen lovastatin (MEVACOR) 40 MG tablet Take 40 mg by mouth at bedtime.    . meloxicam (MOBIC) 7.5 MG tablet Take by mouth.    . metFORMIN (GLUCOPHAGE) 1000 MG tablet Takes 1000 mg am and 1500 mg pm daily.    . pioglitazone (ACTOS) 45 MG tablet Take 45 mg by mouth daily.    . Vitamin D, Cholecalciferol, 1000 UNITS CAPS Take 1 capsule by mouth daily.     No current facility-administered medications for this visit.    Review of Systems Review of Systems  Constitutional: Negative.  Respiratory: Negative.  Cardiovascular: Negative.  Gastrointestinal: Negative.    Blood pressure 122/74, pulse 70, resp. rate 12, height 6\' 5"  (1.956 m), weight 167 lb (75.751 kg).  Physical Exam  Physical Exam  Constitutional: He is oriented to person, place, and time. He appears well-developed and well-nourished.  Eyes: Conjunctivae are normal. No scleral icterus.  Neck: Neck supple.  Cardiovascular: Normal rate, regular rhythm and normal heart sounds.  Pulmonary/Chest: Effort normal and breath sounds normal.  Lymphadenopathy:   He has no cervical adenopathy.  Neurological: He is alert and oriented to person, place, and time.  Skin: Skin is warm and dry.    Data Reviewed 2011 colonoscopy was normal.  Laboratory studies dated 07/15/2014 showed a hemoglobin A1c of 7.1.  Creatinine 0.9 with an estimated GFR 100. Normal electrolytes. Normal liver function studies.  CBC of the same  date showed a hemoglobin of 13.2 with an MCV of 94, white blood cell count 4600. Platelet count 175,000.    Assessment    Past history: malignant polyp of the rectum.  Insulin-dependent diabetes mellitus. Recently well controlled.  Previous TIA/CABG 2014.    Plan    Colonoscopy with possible biopsy/polypectomy prn: Information regarding the procedure, including its potential risks and complications (including but not limited to perforation of the bowel, which may require emergency surgery to repair, and bleeding) was verbally given to the patient. Educational information regarding lower instestinal endoscopy was given to the patient. Written instructions for how to complete the bowel prep using Miralax were provided. The importance of drinking ample fluids to avoid dehydration as a result of the prep emphasized.   The patient will need to discontinue his Plavix 1 week prior to the procedure. We will have him hold his metformin and Glucotrol prior to the procedure. He will continue on his present Lantus and Actos doses.      PCP: Dr Fanny Skates

## 2014-11-24 ENCOUNTER — Telehealth: Payer: Self-pay

## 2014-11-24 NOTE — Telephone Encounter (Signed)
Patient stopped by the office today to cancel his Colonoscopy scheduled for 11/26/14. He states that his blood pressure has been running too high and that he will be seeing his primary care doctor for this and then he will reschedule at that time. I let him know that we would cancel this for now. Endoscopy has been notified of the cancellation.

## 2014-11-26 ENCOUNTER — Ambulatory Visit
Admission: RE | Admit: 2014-11-26 | Payer: Commercial Managed Care - HMO | Source: Ambulatory Visit | Admitting: General Surgery

## 2014-11-26 ENCOUNTER — Encounter: Admission: RE | Payer: Self-pay | Source: Ambulatory Visit

## 2014-11-26 SURGERY — COLONOSCOPY WITH PROPOFOL
Anesthesia: General

## 2014-12-02 ENCOUNTER — Encounter: Admission: RE | Payer: Self-pay | Source: Ambulatory Visit

## 2014-12-02 ENCOUNTER — Ambulatory Visit: Admission: RE | Admit: 2014-12-02 | Payer: Self-pay | Source: Ambulatory Visit | Admitting: Ophthalmology

## 2014-12-02 HISTORY — DX: Scoliosis, unspecified: M41.9

## 2014-12-02 HISTORY — DX: Presence of dental prosthetic device (complete) (partial): Z97.2

## 2014-12-02 HISTORY — DX: Other symptoms and signs involving the musculoskeletal system: R29.898

## 2014-12-02 HISTORY — DX: Other specified disorders of nose and nasal sinuses: J34.89

## 2014-12-02 HISTORY — DX: Transient cerebral ischemic attack, unspecified: G45.9

## 2014-12-02 SURGERY — BLEPHAROPLASTY
Anesthesia: IV Sedation (MBSC Only) | Laterality: Bilateral

## 2014-12-09 DIAGNOSIS — E119 Type 2 diabetes mellitus without complications: Secondary | ICD-10-CM | POA: Diagnosis not present

## 2014-12-09 DIAGNOSIS — E782 Mixed hyperlipidemia: Secondary | ICD-10-CM | POA: Diagnosis not present

## 2014-12-09 DIAGNOSIS — E538 Deficiency of other specified B group vitamins: Secondary | ICD-10-CM | POA: Diagnosis not present

## 2014-12-09 DIAGNOSIS — I10 Essential (primary) hypertension: Secondary | ICD-10-CM | POA: Diagnosis not present

## 2015-01-09 DIAGNOSIS — E538 Deficiency of other specified B group vitamins: Secondary | ICD-10-CM | POA: Diagnosis not present

## 2015-02-10 DIAGNOSIS — E538 Deficiency of other specified B group vitamins: Secondary | ICD-10-CM | POA: Diagnosis not present

## 2015-03-12 DIAGNOSIS — E11329 Type 2 diabetes mellitus with mild nonproliferative diabetic retinopathy without macular edema: Secondary | ICD-10-CM | POA: Diagnosis not present

## 2015-03-13 DIAGNOSIS — E538 Deficiency of other specified B group vitamins: Secondary | ICD-10-CM | POA: Diagnosis not present

## 2015-03-30 DIAGNOSIS — E538 Deficiency of other specified B group vitamins: Secondary | ICD-10-CM | POA: Diagnosis not present

## 2015-03-30 DIAGNOSIS — I10 Essential (primary) hypertension: Secondary | ICD-10-CM | POA: Diagnosis not present

## 2015-03-30 DIAGNOSIS — E119 Type 2 diabetes mellitus without complications: Secondary | ICD-10-CM | POA: Diagnosis not present

## 2015-03-30 DIAGNOSIS — E782 Mixed hyperlipidemia: Secondary | ICD-10-CM | POA: Diagnosis not present

## 2015-04-13 DIAGNOSIS — E538 Deficiency of other specified B group vitamins: Secondary | ICD-10-CM | POA: Diagnosis not present

## 2015-05-14 DIAGNOSIS — E538 Deficiency of other specified B group vitamins: Secondary | ICD-10-CM | POA: Diagnosis not present

## 2015-05-25 DIAGNOSIS — I10 Essential (primary) hypertension: Secondary | ICD-10-CM | POA: Diagnosis not present

## 2015-05-25 DIAGNOSIS — N400 Enlarged prostate without lower urinary tract symptoms: Secondary | ICD-10-CM | POA: Diagnosis not present

## 2015-05-25 DIAGNOSIS — I2510 Atherosclerotic heart disease of native coronary artery without angina pectoris: Secondary | ICD-10-CM | POA: Diagnosis not present

## 2015-05-25 DIAGNOSIS — E119 Type 2 diabetes mellitus without complications: Secondary | ICD-10-CM | POA: Diagnosis not present

## 2015-05-25 DIAGNOSIS — E7800 Pure hypercholesterolemia, unspecified: Secondary | ICD-10-CM | POA: Diagnosis not present

## 2015-05-25 DIAGNOSIS — M160 Bilateral primary osteoarthritis of hip: Secondary | ICD-10-CM | POA: Diagnosis not present

## 2015-06-16 DIAGNOSIS — E538 Deficiency of other specified B group vitamins: Secondary | ICD-10-CM | POA: Diagnosis not present

## 2015-07-01 DIAGNOSIS — L282 Other prurigo: Secondary | ICD-10-CM | POA: Diagnosis not present

## 2015-07-15 DIAGNOSIS — M5440 Lumbago with sciatica, unspecified side: Secondary | ICD-10-CM | POA: Diagnosis not present

## 2015-07-17 DIAGNOSIS — E538 Deficiency of other specified B group vitamins: Secondary | ICD-10-CM | POA: Diagnosis not present

## 2015-08-11 DIAGNOSIS — I10 Essential (primary) hypertension: Secondary | ICD-10-CM | POA: Diagnosis not present

## 2015-08-11 DIAGNOSIS — E119 Type 2 diabetes mellitus without complications: Secondary | ICD-10-CM | POA: Diagnosis not present

## 2015-08-11 DIAGNOSIS — Z125 Encounter for screening for malignant neoplasm of prostate: Secondary | ICD-10-CM | POA: Diagnosis not present

## 2015-08-11 DIAGNOSIS — E7800 Pure hypercholesterolemia, unspecified: Secondary | ICD-10-CM | POA: Diagnosis not present

## 2015-08-18 DIAGNOSIS — Z0000 Encounter for general adult medical examination without abnormal findings: Secondary | ICD-10-CM | POA: Diagnosis not present

## 2015-08-18 DIAGNOSIS — I10 Essential (primary) hypertension: Secondary | ICD-10-CM | POA: Diagnosis not present

## 2015-08-18 DIAGNOSIS — E7800 Pure hypercholesterolemia, unspecified: Secondary | ICD-10-CM | POA: Diagnosis not present

## 2015-08-18 DIAGNOSIS — I2510 Atherosclerotic heart disease of native coronary artery without angina pectoris: Secondary | ICD-10-CM | POA: Diagnosis not present

## 2015-08-18 DIAGNOSIS — E538 Deficiency of other specified B group vitamins: Secondary | ICD-10-CM | POA: Diagnosis not present

## 2015-08-18 DIAGNOSIS — G459 Transient cerebral ischemic attack, unspecified: Secondary | ICD-10-CM | POA: Diagnosis not present

## 2015-08-18 DIAGNOSIS — M150 Primary generalized (osteo)arthritis: Secondary | ICD-10-CM | POA: Diagnosis not present

## 2015-08-18 DIAGNOSIS — E119 Type 2 diabetes mellitus without complications: Secondary | ICD-10-CM | POA: Diagnosis not present

## 2015-08-18 DIAGNOSIS — D510 Vitamin B12 deficiency anemia due to intrinsic factor deficiency: Secondary | ICD-10-CM | POA: Diagnosis not present

## 2015-09-08 DIAGNOSIS — E113393 Type 2 diabetes mellitus with moderate nonproliferative diabetic retinopathy without macular edema, bilateral: Secondary | ICD-10-CM | POA: Diagnosis not present

## 2015-09-09 DIAGNOSIS — M5136 Other intervertebral disc degeneration, lumbar region: Secondary | ICD-10-CM | POA: Diagnosis not present

## 2015-09-09 DIAGNOSIS — M5416 Radiculopathy, lumbar region: Secondary | ICD-10-CM | POA: Diagnosis not present

## 2015-09-15 ENCOUNTER — Ambulatory Visit: Payer: Self-pay | Admitting: *Deleted

## 2015-09-18 DIAGNOSIS — E538 Deficiency of other specified B group vitamins: Secondary | ICD-10-CM | POA: Diagnosis not present

## 2015-10-13 ENCOUNTER — Ambulatory Visit: Payer: Self-pay | Admitting: *Deleted

## 2015-10-19 DIAGNOSIS — E538 Deficiency of other specified B group vitamins: Secondary | ICD-10-CM | POA: Diagnosis not present

## 2015-11-17 DIAGNOSIS — E785 Hyperlipidemia, unspecified: Secondary | ICD-10-CM | POA: Diagnosis not present

## 2015-11-17 DIAGNOSIS — I6529 Occlusion and stenosis of unspecified carotid artery: Secondary | ICD-10-CM | POA: Diagnosis not present

## 2015-11-17 DIAGNOSIS — I10 Essential (primary) hypertension: Secondary | ICD-10-CM | POA: Diagnosis not present

## 2015-11-17 DIAGNOSIS — I6523 Occlusion and stenosis of bilateral carotid arteries: Secondary | ICD-10-CM | POA: Diagnosis not present

## 2015-11-17 DIAGNOSIS — E119 Type 2 diabetes mellitus without complications: Secondary | ICD-10-CM | POA: Diagnosis not present

## 2015-11-19 DIAGNOSIS — E538 Deficiency of other specified B group vitamins: Secondary | ICD-10-CM | POA: Diagnosis not present

## 2015-11-19 DIAGNOSIS — E119 Type 2 diabetes mellitus without complications: Secondary | ICD-10-CM | POA: Diagnosis not present

## 2015-11-19 DIAGNOSIS — I2510 Atherosclerotic heart disease of native coronary artery without angina pectoris: Secondary | ICD-10-CM | POA: Diagnosis not present

## 2015-11-19 DIAGNOSIS — E7800 Pure hypercholesterolemia, unspecified: Secondary | ICD-10-CM | POA: Diagnosis not present

## 2015-11-19 DIAGNOSIS — I10 Essential (primary) hypertension: Secondary | ICD-10-CM | POA: Diagnosis not present

## 2015-11-19 DIAGNOSIS — H04123 Dry eye syndrome of bilateral lacrimal glands: Secondary | ICD-10-CM | POA: Diagnosis not present

## 2015-11-20 DIAGNOSIS — Z794 Long term (current) use of insulin: Secondary | ICD-10-CM | POA: Diagnosis not present

## 2015-11-20 DIAGNOSIS — I10 Essential (primary) hypertension: Secondary | ICD-10-CM | POA: Diagnosis not present

## 2015-11-20 DIAGNOSIS — E119 Type 2 diabetes mellitus without complications: Secondary | ICD-10-CM | POA: Diagnosis not present

## 2015-11-20 DIAGNOSIS — E7800 Pure hypercholesterolemia, unspecified: Secondary | ICD-10-CM | POA: Diagnosis not present

## 2015-11-20 DIAGNOSIS — R634 Abnormal weight loss: Secondary | ICD-10-CM | POA: Diagnosis not present

## 2016-02-08 DIAGNOSIS — E538 Deficiency of other specified B group vitamins: Secondary | ICD-10-CM | POA: Diagnosis not present

## 2016-03-10 DIAGNOSIS — E538 Deficiency of other specified B group vitamins: Secondary | ICD-10-CM | POA: Diagnosis not present

## 2016-03-10 DIAGNOSIS — E113393 Type 2 diabetes mellitus with moderate nonproliferative diabetic retinopathy without macular edema, bilateral: Secondary | ICD-10-CM | POA: Diagnosis not present

## 2016-03-15 DIAGNOSIS — Z794 Long term (current) use of insulin: Secondary | ICD-10-CM | POA: Diagnosis not present

## 2016-03-15 DIAGNOSIS — I10 Essential (primary) hypertension: Secondary | ICD-10-CM | POA: Diagnosis not present

## 2016-03-15 DIAGNOSIS — D510 Vitamin B12 deficiency anemia due to intrinsic factor deficiency: Secondary | ICD-10-CM | POA: Diagnosis not present

## 2016-03-15 DIAGNOSIS — E119 Type 2 diabetes mellitus without complications: Secondary | ICD-10-CM | POA: Diagnosis not present

## 2016-03-15 DIAGNOSIS — E7800 Pure hypercholesterolemia, unspecified: Secondary | ICD-10-CM | POA: Diagnosis not present

## 2016-03-22 DIAGNOSIS — I10 Essential (primary) hypertension: Secondary | ICD-10-CM | POA: Diagnosis not present

## 2016-03-22 DIAGNOSIS — N400 Enlarged prostate without lower urinary tract symptoms: Secondary | ICD-10-CM | POA: Diagnosis not present

## 2016-03-22 DIAGNOSIS — E538 Deficiency of other specified B group vitamins: Secondary | ICD-10-CM | POA: Diagnosis not present

## 2016-03-22 DIAGNOSIS — M5136 Other intervertebral disc degeneration, lumbar region: Secondary | ICD-10-CM | POA: Diagnosis not present

## 2016-03-22 DIAGNOSIS — E119 Type 2 diabetes mellitus without complications: Secondary | ICD-10-CM | POA: Diagnosis not present

## 2016-03-22 DIAGNOSIS — D518 Other vitamin B12 deficiency anemias: Secondary | ICD-10-CM | POA: Diagnosis not present

## 2016-03-22 DIAGNOSIS — E7800 Pure hypercholesterolemia, unspecified: Secondary | ICD-10-CM | POA: Diagnosis not present

## 2016-04-04 DIAGNOSIS — D518 Other vitamin B12 deficiency anemias: Secondary | ICD-10-CM | POA: Diagnosis not present

## 2016-04-11 DIAGNOSIS — E538 Deficiency of other specified B group vitamins: Secondary | ICD-10-CM | POA: Diagnosis not present

## 2016-05-12 DIAGNOSIS — H02403 Unspecified ptosis of bilateral eyelids: Secondary | ICD-10-CM | POA: Diagnosis not present

## 2016-05-12 DIAGNOSIS — E538 Deficiency of other specified B group vitamins: Secondary | ICD-10-CM | POA: Diagnosis not present

## 2016-06-08 ENCOUNTER — Telehealth (INDEPENDENT_AMBULATORY_CARE_PROVIDER_SITE_OTHER): Payer: Self-pay | Admitting: Vascular Surgery

## 2016-06-08 NOTE — Telephone Encounter (Signed)
Patient having eye surgery 06/27/16 with Dr. Vickki Muff in [facility]. He states they want him to stop plavix and Asprin for 7days prior. He wants to know if this is ok. Please let the office know and call him.

## 2016-06-14 DIAGNOSIS — E538 Deficiency of other specified B group vitamins: Secondary | ICD-10-CM | POA: Diagnosis not present

## 2016-07-11 ENCOUNTER — Observation Stay: Payer: Medicare HMO

## 2016-07-11 ENCOUNTER — Encounter: Payer: Self-pay | Admitting: Radiology

## 2016-07-11 ENCOUNTER — Observation Stay
Admission: EM | Admit: 2016-07-11 | Discharge: 2016-07-13 | Disposition: A | Discharge status: Home / Self Care | Payer: Medicare HMO | Attending: Internal Medicine | Admitting: Internal Medicine

## 2016-07-11 ENCOUNTER — Emergency Department: Payer: Medicare HMO

## 2016-07-11 ENCOUNTER — Observation Stay
Admit: 2016-07-11 | Discharge: 2016-07-11 | Disposition: A | Discharge status: Home / Self Care | Payer: Medicare HMO | Attending: Internal Medicine | Admitting: Internal Medicine

## 2016-07-11 DIAGNOSIS — K219 Gastro-esophageal reflux disease without esophagitis: Secondary | ICD-10-CM | POA: Insufficient documentation

## 2016-07-11 DIAGNOSIS — Z9889 Other specified postprocedural states: Secondary | ICD-10-CM | POA: Insufficient documentation

## 2016-07-11 DIAGNOSIS — I6523 Occlusion and stenosis of bilateral carotid arteries: Secondary | ICD-10-CM | POA: Diagnosis not present

## 2016-07-11 DIAGNOSIS — I2510 Atherosclerotic heart disease of native coronary artery without angina pectoris: Secondary | ICD-10-CM | POA: Diagnosis not present

## 2016-07-11 DIAGNOSIS — Z7951 Long term (current) use of inhaled steroids: Secondary | ICD-10-CM | POA: Diagnosis not present

## 2016-07-11 DIAGNOSIS — I10 Essential (primary) hypertension: Secondary | ICD-10-CM | POA: Diagnosis not present

## 2016-07-11 DIAGNOSIS — Z79899 Other long term (current) drug therapy: Secondary | ICD-10-CM | POA: Insufficient documentation

## 2016-07-11 DIAGNOSIS — Z888 Allergy status to other drugs, medicaments and biological substances status: Secondary | ICD-10-CM | POA: Diagnosis not present

## 2016-07-11 DIAGNOSIS — Z7982 Long term (current) use of aspirin: Secondary | ICD-10-CM | POA: Insufficient documentation

## 2016-07-11 DIAGNOSIS — M419 Scoliosis, unspecified: Secondary | ICD-10-CM | POA: Insufficient documentation

## 2016-07-11 DIAGNOSIS — Z85048 Personal history of other malignant neoplasm of rectum, rectosigmoid junction, and anus: Secondary | ICD-10-CM | POA: Diagnosis not present

## 2016-07-11 DIAGNOSIS — G450 Vertebro-basilar artery syndrome: Principal | ICD-10-CM

## 2016-07-11 DIAGNOSIS — E785 Hyperlipidemia, unspecified: Secondary | ICD-10-CM | POA: Insufficient documentation

## 2016-07-11 DIAGNOSIS — Z8249 Family history of ischemic heart disease and other diseases of the circulatory system: Secondary | ICD-10-CM | POA: Insufficient documentation

## 2016-07-11 DIAGNOSIS — I6502 Occlusion and stenosis of left vertebral artery: Secondary | ICD-10-CM | POA: Diagnosis not present

## 2016-07-11 DIAGNOSIS — Z794 Long term (current) use of insulin: Secondary | ICD-10-CM | POA: Diagnosis not present

## 2016-07-11 DIAGNOSIS — Z951 Presence of aortocoronary bypass graft: Secondary | ICD-10-CM | POA: Diagnosis not present

## 2016-07-11 DIAGNOSIS — E119 Type 2 diabetes mellitus without complications: Secondary | ICD-10-CM | POA: Diagnosis not present

## 2016-07-11 DIAGNOSIS — Z9841 Cataract extraction status, right eye: Secondary | ICD-10-CM | POA: Diagnosis not present

## 2016-07-11 DIAGNOSIS — Z8546 Personal history of malignant neoplasm of prostate: Secondary | ICD-10-CM | POA: Diagnosis not present

## 2016-07-11 DIAGNOSIS — Z7902 Long term (current) use of antithrombotics/antiplatelets: Secondary | ICD-10-CM | POA: Diagnosis not present

## 2016-07-11 DIAGNOSIS — Z8673 Personal history of transient ischemic attack (TIA), and cerebral infarction without residual deficits: Secondary | ICD-10-CM | POA: Diagnosis not present

## 2016-07-11 DIAGNOSIS — G459 Transient cerebral ischemic attack, unspecified: Secondary | ICD-10-CM | POA: Diagnosis not present

## 2016-07-11 DIAGNOSIS — R42 Dizziness and giddiness: Secondary | ICD-10-CM

## 2016-07-11 DIAGNOSIS — N400 Enlarged prostate without lower urinary tract symptoms: Secondary | ICD-10-CM | POA: Diagnosis not present

## 2016-07-11 DIAGNOSIS — M6281 Muscle weakness (generalized): Secondary | ICD-10-CM

## 2016-07-11 DIAGNOSIS — R262 Difficulty in walking, not elsewhere classified: Secondary | ICD-10-CM

## 2016-07-11 LAB — GLUCOSE, CAPILLARY
GLUCOSE-CAPILLARY: 171 mg/dL — AB (ref 65–99)
Glucose-Capillary: 205 mg/dL — ABNORMAL HIGH (ref 65–99)
Glucose-Capillary: 221 mg/dL — ABNORMAL HIGH (ref 65–99)
Glucose-Capillary: 102 mg/dL — ABNORMAL HIGH (ref 65–99)
Glucose-Capillary: 201 mg/dL — ABNORMAL HIGH (ref 65–99)

## 2016-07-11 LAB — CBC WITH DIFFERENTIAL/PLATELET
BASOS ABS: 0 10*3/uL (ref 0–0.1)
Basophils Relative: 0 %
EOS PCT: 1 %
Eosinophils Absolute: 0.1 10*3/uL (ref 0–0.7)
HEMATOCRIT: 38.3 % — AB (ref 40.0–52.0)
HEMOGLOBIN: 12.7 g/dL — AB (ref 13.0–18.0)
LYMPHS PCT: 41 %
Lymphs Abs: 2.4 10*3/uL (ref 1.0–3.6)
MCH: 30.5 pg (ref 26.0–34.0)
MCHC: 33.2 g/dL (ref 32.0–36.0)
MCV: 92.1 fL (ref 80.0–100.0)
Monocytes Absolute: 0.5 10*3/uL (ref 0.2–1.0)
Monocytes Relative: 9 %
NEUTROS ABS: 2.9 10*3/uL (ref 1.4–6.5)
NEUTROS PCT: 49 %
PLATELETS: 166 10*3/uL (ref 150–440)
RBC: 4.16 MIL/uL — AB (ref 4.40–5.90)
RDW: 13.6 % (ref 11.5–14.5)
WBC: 6 10*3/uL (ref 3.8–10.6)

## 2016-07-11 LAB — COMPREHENSIVE METABOLIC PANEL
ALT: 15 U/L — AB (ref 17–63)
AST: 20 U/L (ref 15–41)
Albumin: 4.1 g/dL (ref 3.5–5.0)
Alkaline Phosphatase: 71 U/L (ref 38–126)
Anion gap: 9 (ref 5–15)
BUN: 14 mg/dL (ref 6–20)
CHLORIDE: 103 mmol/L (ref 101–111)
CO2: 23 mmol/L (ref 22–32)
CREATININE: 0.85 mg/dL (ref 0.61–1.24)
Calcium: 9.1 mg/dL (ref 8.9–10.3)
GFR calc Af Amer: >60 mL/min (ref >60)
Glucose, Bld: 183 mg/dL — ABNORMAL HIGH (ref 65–99)
Potassium: 3.5 mmol/L (ref 3.5–5.1)
Sodium: 135 mmol/L (ref 135–145)
Total Bilirubin: 0.8 mg/dL (ref 0.3–1.2)
Total Protein: 7.1 g/dL (ref 6.5–8.1)

## 2016-07-11 LAB — LIPID PANEL
Cholesterol: 154 mg/dL (ref 0–200)
HDL: 60 mg/dL (ref >40)
LDL Cholesterol: 87 mg/dL (ref 0–99)
Total CHOL/HDL Ratio: 2.6 RATIO
Triglycerides: 33 mg/dL (ref <150)
VLDL: 7 mg/dL (ref 0–40)

## 2016-07-11 LAB — TSH: TSH: 1.655 u[IU]/mL (ref 0.350–4.500)

## 2016-07-11 LAB — TROPONIN I

## 2016-07-11 MED ORDER — PRAVASTATIN SODIUM 20 MG PO TABS
40.0000 mg | ORAL_TABLET | Freq: Every day | ORAL | Status: DC
  Administered 2016-07-11 – 2016-07-12 (×2): 40 mg via ORAL
  Filled 2016-07-11 (×2): qty 2

## 2016-07-11 MED ORDER — METOPROLOL SUCCINATE ER 100 MG PO TB24
100.0000 mg | ORAL_TABLET | Freq: Every day | ORAL | Status: DC
  Administered 2016-07-11 – 2016-07-13 (×3): 100 mg via ORAL
  Filled 2016-07-11 (×3): qty 1

## 2016-07-11 MED ORDER — BENAZEPRIL HCL 20 MG PO TABS
40.0000 mg | ORAL_TABLET | Freq: Two times a day (BID) | ORAL | Status: DC
  Administered 2016-07-11 – 2016-07-13 (×5): 40 mg via ORAL
  Filled 2016-07-11 (×6): qty 2

## 2016-07-11 MED ORDER — ADULT MULTIVITAMIN W/MINERALS CH
1.0000 | ORAL_TABLET | Freq: Every day | ORAL | Status: DC
  Administered 2016-07-11 – 2016-07-13 (×3): 1 via ORAL
  Filled 2016-07-11 (×3): qty 1

## 2016-07-11 MED ORDER — PNEUMOCOCCAL VAC POLYVALENT 25 MCG/0.5ML IJ INJ
0.5000 mL | INJECTION | INTRAMUSCULAR | Status: DC
  Filled 2016-07-11: qty 0.5

## 2016-07-11 MED ORDER — FLUTICASONE PROPIONATE 50 MCG/ACT NA SUSP
1.0000 | Freq: Two times a day (BID) | NASAL | Status: DC
  Administered 2016-07-11 – 2016-07-13 (×5): 1 via NASAL
  Filled 2016-07-11: qty 16

## 2016-07-11 MED ORDER — VITAMIN D 1000 UNITS PO TABS
1000.0000 [IU] | ORAL_TABLET | Freq: Every day | ORAL | Status: DC
  Administered 2016-07-11 – 2016-07-13 (×3): 1000 [IU] via ORAL
  Filled 2016-07-11 (×3): qty 1

## 2016-07-11 MED ORDER — LORAZEPAM 1 MG PO TABS
1.0000 mg | ORAL_TABLET | Freq: Once | ORAL | Status: AC
  Administered 2016-07-11: 14:00:00 1 mg via ORAL
  Filled 2016-07-11: qty 1

## 2016-07-11 MED ORDER — ACETAMINOPHEN 325 MG PO TABS: 650.0000 mg | ORAL_TABLET | Freq: Four times a day (QID) | ORAL | Status: DC | PRN

## 2016-07-11 MED ORDER — INSULIN GLARGINE 100 UNIT/ML ~~LOC~~ SOLN
5.0000 [IU] | Freq: Every day | SUBCUTANEOUS | Status: DC
Start: 2016-07-11 — End: 2016-07-12
  Administered 2016-07-11: 22:00:00 5 [IU] via SUBCUTANEOUS
  Filled 2016-07-11 (×2): qty 0.05

## 2016-07-11 MED ORDER — FINASTERIDE 5 MG PO TABS
5.0000 mg | ORAL_TABLET | Freq: Every day | ORAL | Status: DC
  Administered 2016-07-11 – 2016-07-13 (×3): 5 mg via ORAL
  Filled 2016-07-11 (×3): qty 1

## 2016-07-11 MED ORDER — ACETAMINOPHEN 650 MG RE SUPP
650.0000 mg | Freq: Four times a day (QID) | RECTAL | Status: DC | PRN
Start: 2016-07-11 — End: 2016-07-13

## 2016-07-11 MED ORDER — SODIUM CHLORIDE 0.9 % IV BOLUS (SEPSIS)
500.0000 mL | Freq: Once | INTRAVENOUS | Status: AC
  Administered 2016-07-11: 500 mL via INTRAVENOUS

## 2016-07-11 MED ORDER — STROKE: EARLY STAGES OF RECOVERY BOOK
Freq: Once | Status: AC
  Administered 2016-07-11: 09:00:00

## 2016-07-11 MED ORDER — POLYVINYL ALCOHOL 1.4 % OP SOLN
1.0000 [drp] | OPHTHALMIC | Status: DC | PRN
  Administered 2016-07-11: 1 [drp] via OPHTHALMIC
  Filled 2016-07-11: qty 15

## 2016-07-11 MED ORDER — ONDANSETRON HCL 4 MG PO TABS: 4.0000 mg | ORAL_TABLET | Freq: Four times a day (QID) | ORAL | Status: DC | PRN

## 2016-07-11 MED ORDER — INSULIN ASPART 100 UNIT/ML ~~LOC~~ SOLN
0.0000 [IU] | Freq: Three times a day (TID) | SUBCUTANEOUS | Status: DC
  Administered 2016-07-11 (×2): 5 [IU] via SUBCUTANEOUS
  Administered 2016-07-12: 3 [IU] via SUBCUTANEOUS
  Administered 2016-07-12: 12:00:00 8 [IU] via SUBCUTANEOUS
  Administered 2016-07-13: 08:00:00 3 [IU] via SUBCUTANEOUS
  Administered 2016-07-13: 5 [IU] via SUBCUTANEOUS
  Filled 2016-07-11: qty 5
  Filled 2016-07-11: qty 3
  Filled 2016-07-11: qty 8
  Filled 2016-07-11: qty 5
  Filled 2016-07-11: qty 3
  Filled 2016-07-11: qty 5

## 2016-07-11 MED ORDER — ENOXAPARIN SODIUM 40 MG/0.4ML ~~LOC~~ SOLN
40.0000 mg | SUBCUTANEOUS | Status: DC
Start: 2016-07-11 — End: 2016-07-13
  Administered 2016-07-11 – 2016-07-13 (×3): 40 mg via SUBCUTANEOUS
  Filled 2016-07-11 (×3): qty 0.4

## 2016-07-11 MED ORDER — IOPAMIDOL (ISOVUE-370) INJECTION 76%
75.0000 mL | Freq: Once | INTRAVENOUS | Status: AC | PRN
  Administered 2016-07-11: 75 mL via INTRAVENOUS

## 2016-07-11 MED ORDER — PANTOPRAZOLE SODIUM 40 MG PO TBEC
40.0000 mg | DELAYED_RELEASE_TABLET | Freq: Every day | ORAL | Status: DC
  Administered 2016-07-11 – 2016-07-13 (×3): 40 mg via ORAL
  Filled 2016-07-11 (×3): qty 1

## 2016-07-11 MED ORDER — AMLODIPINE BESYLATE 10 MG PO TABS
10.0000 mg | ORAL_TABLET | Freq: Every day | ORAL | Status: DC
  Administered 2016-07-11 – 2016-07-13 (×3): 10 mg via ORAL
  Filled 2016-07-11 (×3): qty 1

## 2016-07-11 MED ORDER — ASPIRIN 81 MG PO CHEW
81.0000 mg | CHEWABLE_TABLET | Freq: Every day | ORAL | Status: DC
Start: 2016-07-11 — End: 2016-07-13
  Administered 2016-07-11 – 2016-07-13 (×3): 81 mg via ORAL
  Filled 2016-07-11 (×3): qty 1

## 2016-07-11 MED ORDER — ONDANSETRON HCL 4 MG/2ML IJ SOLN: 4.0000 mg | Freq: Four times a day (QID) | INTRAMUSCULAR | Status: DC | PRN

## 2016-07-11 MED ORDER — CLOPIDOGREL BISULFATE 75 MG PO TABS
75.0000 mg | ORAL_TABLET | Freq: Every day | ORAL | Status: DC
  Administered 2016-07-11 – 2016-07-13 (×3): 75 mg via ORAL
  Filled 2016-07-11 (×3): qty 1

## 2016-07-11 MED ORDER — VITAMIN B-12 1000 MCG PO TABS
5000.0000 ug | ORAL_TABLET | Freq: Every day | ORAL | Status: DC
  Administered 2016-07-11 – 2016-07-13 (×3): 5000 ug via ORAL
  Filled 2016-07-11 (×3): qty 5

## 2016-07-11 MED ORDER — DOCUSATE SODIUM 100 MG PO CAPS
100.0000 mg | ORAL_CAPSULE | Freq: Two times a day (BID) | ORAL | Status: DC
  Administered 2016-07-11 – 2016-07-13 (×5): 100 mg via ORAL
  Filled 2016-07-11 (×5): qty 1

## 2016-07-11 NOTE — ED Notes (Signed)
Pt updated on treatment progression.

## 2016-07-11 NOTE — ED Notes (Signed)
Pt updated on treatment plan. Pt to be transferred to [facility] per dr. Dahlia Client.

## 2016-07-11 NOTE — ED Notes (Signed)
Floor rn on 1c states they can not accept pt at this time without swallow screen being completed before pt goes to floor.

## 2016-07-11 NOTE — Progress Notes (Addendum)
Vital signs are stable. Dizziness is better.  Physical examinations is unremarkable.  A/P This is a 76 year old male admitted for vertebrobasilar insufficiency.  1. Dizziness: Secondary to vertebrobasilar insufficiency.   CTA of the head actually cranial vessels confirmed severe carotid artery stenosis on the left as well as vertebrobasilar insufficiency.  No CVA per MRI of the brain and f/u echocardiogram. Follow-up Neurology consult.  2. Coronary artery disease: Stable; continue aspirin and Plavix.  3. Hypertension: Less than optimal control although some permissive hypertension is good for his current condition. Continue amlodipine, ACE inhibitor and long-acting metoprolol.  4. Diabetes mellitus type II: Hold oral hypoglycemic agents; continue basal insulin just for hospital diet. Sliding scale insulin while hospitalized. 5. BPH: Continue Proscar  PT evaluation suggested home health with PT.  I discussed with the patient, his daughter and brother.

## 2016-07-11 NOTE — Evaluation (Signed)
Occupational Therapy Evaluation Patient Details Name: Jeremy Braun MRN: [id number] DOB: [birth year] Today's Date: 07/11/2016    History of Present Illness Pt is a 76yo male with PMHx of carotid artery stenosis, coronary artery disease s/p CABG and TIAs who presented to the ED with c/o dizziness. He denies nausea or vomiting, chest pain or shortness of breath. In the ED the pt continued to feel dizzy but the sensation subsided in severity. Head CTA confirmed severe carotid artery stenosis on the left as well as vertebrobasilar insufficiency.    Clinical Impression   76 yo male presenting with decreased activity tolerance, reports of recent dizziness (none reported during session), chronic R shoulder pain due to arthritis per pt report, and decreased knowledge of AE for LB ADL and ECS. Pt would benefit from skilled OT services to address noted impairments and educate/train pt in ECS and falls prevention strategies to support safety and minimize falls risk prior to return home.     Follow Up Recommendations  No OT follow up    Equipment Recommendations  Tub/shower seat    Recommendations for Other Services       Precautions / Restrictions Precautions Precautions: Fall      Mobility Bed Mobility Overal bed mobility: Modified Independent             General bed mobility comments: pt performed sup>sit EOB with modif indep taking additional time to complete but no physical assist required. Pt educated in techniques to minimize dizziness upon changing body positions (sup>sit, sit>stand, bending over)  Transfers                 General transfer comment: pt reports performing sit<>stand transfers from EOB, ambulating to bathroom, and performing toilet transfer independently right before OT session    Balance Overall balance assessment: No apparent balance deficits (not formally assessed)                                          ADL Overall ADL's :  Needs assistance/impaired Eating/Feeding: Set up;Sitting   Grooming: Set up;Sitting   Upper Body Bathing: Supervision/ safety;Sitting;Set up   Lower Body Bathing: Set up;Sit to/from stand;Min guard   Upper Body Dressing : Set up;Sitting   Lower Body Dressing: Min guard;Sit to/from stand   Toilet Transfer: Modified Programmer, applications Details (indicate cue type and reason): pt reports performing toilet transfer on his own, no physical assist required, went slowly and reports no LOB, SOB, or dizziness Toileting- Clothing Manipulation and Hygiene: Independent Toileting - Clothing Manipulation Details (indicate cue type and reason): per pt report did not require assist for clothing mgt or hygiene during toileting task prior to OT session     Functional mobility during ADLs: Supervision/safety (pt reports ambulating to bathroom independently, family in room at the time) General ADL Comments: Pt would benefit from additional education/training in ECS and compensatory strategies to minimize need for unnecessary movement/change in body position that may cause dizziness, as pt reports this has happened before     Vision Vision Assessment?: No apparent visual deficits   Perception     Praxis      Pertinent Vitals/Pain Pain Assessment: 0-10 Pain Score: 2  Pain Location: chronic R shoulder pain due to arthritis, per pt report Pain Intervention(s): Limited activity within patient's tolerance;Monitored during session     Hand Dominance     Extremity/Trunk  Assessment Upper Extremity Assessment Upper Extremity Assessment: Overall WFL for tasks assessed   Lower Extremity Assessment Lower Extremity Assessment: Defer to PT evaluation   Cervical / Trunk Assessment Cervical / Trunk Assessment: Normal   Communication Communication Communication: No difficulties   Cognition Arousal/Alertness: Awake/alert Behavior During Therapy: WFL for tasks assessed/performed Overall  Cognitive Status: Within Functional Limits for tasks assessed                     General Comments       Exercises       Shoulder Instructions      Home Living Family/patient expects to be discharged to:: Private residence Living Arrangements: Non-relatives/Friends;Children (pt lives with male friend and 57yo son) Available Help at Discharge: Family;Friend(s);Available 24 hours/day Type of Home: Mobile home Home Access: Stairs to enter Entrance Stairs-Number of Steps: 4 STE Entrance Stairs-Rails: Can reach both;Left;Right Home Layout: One level     Bathroom Shower/Tub: Tub/shower unit Shower/tub characteristics: Architectural technologist: Standard Bathroom Accessibility: Yes How Accessible: Accessible via walker Home Equipment: None          Prior Functioning/Environment Level of Independence: Independent        Comments: Pt reports indep with all ADL/IADL, driving, working part time        OT Problem List: Other (comment);Decreased knowledge of use of DME or AE;Decreased activity tolerance (decreased knowledge of energy conservation strategies)   OT Treatment/Interventions: Self-care/ADL training;DME and/or AE instruction;Patient/family education;Energy conservation    OT Goals(Current goals can be found in the care plan section) Acute Rehab OT Goals Patient Stated Goal: go home OT Goal Formulation: With patient Time For Goal Achievement: 07/25/16 Potential to Achieve Goals: Good  OT Frequency: Min 1X/week   Barriers to D/C:            Co-evaluation              End of Session    Activity Tolerance: Patient tolerated treatment well Patient left: in bed;with call bell/phone within reach;with family/visitor present;Other (comment) (PT present at end of OT session)   Time: [phone] OT Time Calculation (min): 17 min Charges:  OT General Charges $OT Visit: 1 Procedure OT Evaluation $OT Eval Low Complexity: 1 Procedure OT  Treatments $Self Care/Home Management : 8-22 mins G-Codes: OT G-codes **NOT FOR INPATIENT CLASS** Functional Limitation: Self care Self Care Current Status [id number]): At least 1 percent but less than 20 percent impaired, limited or restricted Self Care Goal Status [id number]): 0 percent impaired, limited or restricted  Corky Sox, OTR/L 07/11/2016, 9:35 AM

## 2016-07-11 NOTE — Progress Notes (Signed)
[facility] responded to an OR for an AD. Pt was alert and sitting in the chair by his bed. Several family members were bedside. [place] educated the Pt and family about the AD. Pt wanted additional time to review the document. Pt requested prayer for healing, which was provided. [facility] is available for follow up as needed.    07/11/16 1300  Clinical Encounter Type  Visited With Patient;Patient and family together  Visit Type Initial;Spiritual support  Referral From Nurse  Consult/Referral To Chaplain

## 2016-07-11 NOTE — Progress Notes (Signed)
SLP Cancellation Note  Patient Details Name: Jeremy Braun MRN: [id number] DOB: 22-Sep-1940   Cancelled treatment:       Reason Eval/Treat Not Completed: SLP screened, no needs identified, will sign off. NSG consulted with no reports of difficulty of understanding pt or pt appearing to have any trouble with swallowing. Pt and family consulted; denied any trouble with getting his words out or communicating with family. Pt stated being at his baseline for speech. Pt and family also denied any problems with swallowing. Pt was eating cereal with no overt s/sx of aspiration. NSG to re-consult if any change during visit.    Eulogio Ditch, B.S Graduate Clinician 07/11/2016, 10:30 AM    This information has been reviewed and agreed upon by this supervising clinician.  Orinda Kenner, [place], CCC-SLP

## 2016-07-11 NOTE — Evaluation (Signed)
Physical Therapy Evaluation Patient Details Name: Jeremy Braun MRN: [id number] DOB: Jul 10, 1940 Today's Date: 07/11/2016   History of Present Illness  Pt is a 76yo male with PMHx of carotid artery stenosis, coronary artery disease s/p CABG and TIAs who presented to the ED with c/o dizziness. He denies nausea or vomiting, chest pain or shortness of breath. In the ED the pt continued to feel dizzy but the sensation subsided in severity. Head CTA confirmed severe carotid artery stenosis on the left as well as vertebrobasilar insufficiency.   Clinical Impression  Pt is a pleasant 76 year old male who was admitted for dizziness with vertebrobasilar artery insufficiency. No neuro or vascular surgery consult noted at this time. Pt performs bed mobility with mod I, transfers with independence, and ambulation with supervision and safe technique. Pt refused further ambulation at this time as he/family upset with service - informed RN of concerns. Pt demonstrates deficits with strength/endurance. Pt reports poor mobility at home secondary to "laziness" per pt report. Need to continue to assess functional mobility. Will keep QD until ruled out. Would benefit from skilled PT to address above deficits and promote optimal return to PLOF. Recommend transition to [facility] upon discharge from acute hospitalization.       Follow Up Recommendations Home health PT    Equipment Recommendations  None recommended by PT    Recommendations for Other Services       Precautions / Restrictions Precautions Precautions: Fall Restrictions Weight Bearing Restrictions: No      Mobility  Bed Mobility Overal bed mobility: Modified Independent             General bed mobility comments: pt impulsive and tries to perform OOB mobility prior to therapist cues. Once seated at EOB, able to sit with upright posture.  Task appeared easy. No dizziness noted with transfer  Transfers Overall transfer level:  Independent Equipment used: None             General transfer comment: transfer performed with upright posture. Safe technique performed.  Ambulation/Gait Ambulation/Gait assistance: Supervision Ambulation Distance (Feet): 3 Feet Assistive device: None Gait Pattern/deviations: Step-to pattern     General Gait Details: ambulated to recliner per patient request as breakfast tray arrived and pt wished to eat. Safe technique performed with minimal ambulation, no dizziness noted  Stairs            Wheelchair Mobility    Modified Rankin (Stroke Patients Only)       Balance Overall balance assessment: No apparent balance deficits (not formally assessed) (due to patient report, want to further assess balance next )                                           Pertinent Vitals/Pain Pain Assessment: 0-10 Pain Score: 2  Pain Location: chronic R shoulder pain due to arthritis, per pt report Pain Intervention(s): Limited activity within patient's tolerance    Home Living Family/patient expects to be discharged to:: Private residence Living Arrangements: Non-relatives/Friends;Children Available Help at Discharge: Family;Friend(s);Available 24 hours/day Type of Home: Mobile home Home Access: Stairs to enter Entrance Stairs-Rails: Can reach both;Left;Right Entrance Stairs-Number of Steps: 4 STE Home Layout: One level Home Equipment: None      Prior Function Level of Independence: Independent         Comments: Pt reports indep with all ADL/IADL, driving, working part time;  however recently has reported he stays in bed all the time     Hand Dominance   Dominant Hand: Right    Extremity/Trunk Assessment   Upper Extremity Assessment Upper Extremity Assessment: Defer to OT evaluation    Lower Extremity Assessment Lower Extremity Assessment: Generalized weakness (L LE grossly 4/5; R LE grossly 4+/5; sensation intact)    Cervical / Trunk  Assessment Cervical / Trunk Assessment: Normal  Communication   Communication: No difficulties  Cognition Arousal/Alertness: Awake/alert Behavior During Therapy: WFL for tasks assessed/performed Overall Cognitive Status: Within Functional Limits for tasks assessed                      General Comments General comments (skin integrity, edema, etc.): requested to assist in changing gown. Pt also upset with meal tray. Informed RN    Exercises     Assessment/Plan    PT Assessment Patient needs continued PT services  PT Problem List Decreased strength;Decreased safety awareness;Pain          PT Treatment Interventions DME instruction;Therapeutic exercise;Stair training;Gait training    PT Goals (Current goals can be found in the Care Plan section)  Acute Rehab PT Goals Patient Stated Goal: go home PT Goal Formulation: With patient Time For Goal Achievement: 07/25/16 Potential to Achieve Goals: Good    Frequency 7X/week   Barriers to discharge        Co-evaluation               End of Session   Activity Tolerance: Patient tolerated treatment well Patient left: in chair;with chair alarm set;with nursing/sitter in room Nurse Communication: Mobility status    Functional Assessment Tool Used: clinical judgement Functional Limitation: Mobility: Walking and moving around Mobility: Walking and Moving Around Current Status [id number]): At least 1 percent but less than 20 percent impaired, limited or restricted Mobility: Walking and Moving Around Goal Status [phone]): 0 percent impaired, limited or restricted    Time: [id number] PT Time Calculation (min) (ACUTE ONLY): 17 min   Charges:   PT Evaluation $PT Eval Low Complexity: 1 Procedure     PT G Codes:   PT G-Codes **NOT FOR INPATIENT CLASS** Functional Assessment Tool Used: clinical judgement Functional Limitation: Mobility: Walking and moving around Mobility: Walking and Moving Around Current Status  [id number]): At least 1 percent but less than 20 percent impaired, limited or restricted Mobility: Walking and Moving Around Goal Status [phone]): 0 percent impaired, limited or restricted    Jeremy Braun 07/11/2016, 10:44 AM  [name], PT, DPT [phone]

## 2016-07-11 NOTE — H&P (Signed)
Jeremy Braun is an 76 y.o. male.   Chief Complaint: Dizziness HPI: The patient with past medical history of carotid artery stenosis, coronary artery disease status post CABG and TIAs presents emergency department complaining of dizziness. The patient states that he was asleep in bed when he awoke feeling as if his head was spinning. He called his son for help to the bathroom where he placed a cold towel on his head to cool off as he felt hot and sweaty. He denies nausea or vomiting, chest pain or shortness of breath. In the emergency department the patient continued to feel dizzy but the sensation subsided in severity by the time of our interview. CTA of the head actually cranial vessels confirmed severe carotid artery stenosis on the left as well as vertebrobasilar insufficiency. The emergency department staff called interventional neurology who recommended further imaging and medical management which prompted the emergency department staff to call the hospitalist service for admission.  Past Medical History:  Diagnosis Date  . Arthritis    knees, shoulder arms  . Carotid stenosis    a. 07/2012 U/S: < 50% bilat stenosis - Followed by Dr. Lucky Cowboy.  . Coronary artery disease    a. s/p CABG;  b. 07/2012 Myoview sm region of mildly dec perfusion in the basal to mid inf wall w/o signif ischemia.  EF 60%.  . DM II (diabetes mellitus, type II), controlled ([place])   . GERD (gastroesophageal reflux disease)   . History of prostate cancer   . Hyperlipidemia   . Hypertension   . Rectal cancer ([place]) 2001   Local resection.  . Scoliosis   . Sinus drainage   . TIA (transient ischemic attack)    several yrs ago - no deficits  . Weakness of both legs   . Wears dentures    full upper    Past Surgical History:  Procedure Laterality Date  . CARDIAC CATHETERIZATION     before CABG  . COLONOSCOPY  09/29/2009  . CORONARY ARTERY BYPASS GRAFT  2014   [facility]  . EYE SURGERY Right 2015   Inplant, Dr Lita Mains  .  INGUINAL HERNIA REPAIR     multiple  . PROSTATE BIOPSY    . SHOULDER SURGERY Left     Family History  Problem Relation Age of Onset  . Heart disease Mother   . Heart disease Brother    Social History:  reports that he has never smoked. He does not have any smokeless tobacco history on file. He reports that he does not drink alcohol or use drugs.  Allergies:  Allergies  Allergen Reactions  . Hctz [Hydrochlorothiazide] Other (See Comments)    Hyponatremia   . Hydralazine Other (See Comments)    hyponatremia  . Reserpine Other (See Comments)    hyponatremia  . Catapres [Clonidine Hcl] Rash    Prior to Admission medications   Medication Sig Start Date End Date Taking? Authorizing Provider  amLODipine (NORVASC) 10 MG tablet Take 10 mg by mouth daily. AM   Yes Historical Provider, MD  aspirin 81 MG tablet Take 81 mg by mouth daily. AM   Yes Historical Provider, MD  benazepril (LOTENSIN) 40 MG tablet Take 40 mg by mouth 2 (two) times daily.   Yes Historical Provider, MD  clopidogrel (PLAVIX) 75 MG tablet Take 75 mg by mouth daily. AM   Yes Historical Provider, MD  Cyanocobalamin (VITAMIN B-12 IJ) Inject as directed every 30 (thirty) days.   Yes Historical Provider, MD  Cyanocobalamin (VITAMIN B-12) 5000 MCG LOZG Take 1 tablet by mouth daily. AM   Yes Historical Provider, MD  finasteride (PROSCAR) 5 MG tablet Take 5 mg by mouth daily. AM   Yes Historical Provider, MD  fluticasone (FLONASE) 50 MCG/ACT nasal spray Place into both nostrils 2 (two) times daily.   Yes Historical Provider, MD  glipiZIDE (GLUCOTROL) 10 MG tablet Take 10 mg by mouth 2 (two) times daily before a meal.   Yes Historical Provider, MD  insulin glargine (LANTUS) 100 UNIT/ML injection Inject 8 Units into the skin at bedtime.    Yes Historical Provider, MD  lovastatin (MEVACOR) 40 MG tablet Take 40 mg by mouth at bedtime.   Yes Historical Provider, MD  metFORMIN (GLUCOPHAGE) 1000 MG tablet Takes 1000 mg am and 1500 mg  pm daily.   Yes Historical Provider, MD  metoprolol succinate (TOPROL-XL) 100 MG 24 hr tablet Take 100 mg by mouth daily. Take with or immediately following a meal.   Yes Historical Provider, MD  Multiple Vitamin (MULTIVITAMIN) capsule Take 1 capsule by mouth daily.   Yes Historical Provider, MD  pantoprazole (PROTONIX) 40 MG tablet Take 40 mg by mouth daily. AM   Yes Historical Provider, MD  pioglitazone (ACTOS) 45 MG tablet Take 45 mg by mouth daily.   Yes Historical Provider, MD  Vitamin D, Cholecalciferol, 1000 UNITS CAPS Take 1 capsule by mouth daily.   Yes Historical Provider, MD  polyethylene glycol powder (GLYCOLAX/MIRALAX) powder 255 grams one bottle for colonoscopy prep 10/01/14   [name], MD     Results for orders placed or performed during the hospital encounter of 07/11/16 (from the past 48 hour(s))  Troponin I     Status: None   Collection Time: 07/11/16 12:56 AM  Result Value Ref Range   Troponin I <0.03 <0.03 ng/mL  CBC with Differential     Status: Abnormal   Collection Time: 07/11/16 12:56 AM  Result Value Ref Range   WBC 6.0 3.8 - 10.6 K/uL   RBC 4.16 (L) 4.40 - 5.90 MIL/uL   Hemoglobin 12.7 (L) 13.0 - 18.0 g/dL   HCT 38.3 (L) 40.0 - 52.0 %   MCV 92.1 80.0 - 100.0 fL   MCH 30.5 26.0 - 34.0 pg   MCHC 33.2 32.0 - 36.0 g/dL   RDW 13.6 11.5 - 14.5 %   Platelets 166 150 - 440 K/uL   Neutrophils Relative % 49 %   Neutro Abs 2.9 1.4 - 6.5 K/uL   Lymphocytes Relative 41 %   Lymphs Abs 2.4 1.0 - 3.6 K/uL   Monocytes Relative 9 %   Monocytes Absolute 0.5 0.2 - 1.0 K/uL   Eosinophils Relative 1 %   Eosinophils Absolute 0.1 0 - 0.7 K/uL   Basophils Relative 0 %   Basophils Absolute 0.0 0 - 0.1 K/uL  Comprehensive metabolic panel     Status: Abnormal   Collection Time: 07/11/16 12:56 AM  Result Value Ref Range   Sodium 135 135 - 145 mmol/L   Potassium 3.5 3.5 - 5.1 mmol/L   Chloride 103 101 - 111 mmol/L   CO2 23 22 - 32 mmol/L   Glucose, Bld 183 (H) 65 - 99  mg/dL   BUN 14 6 - 20 mg/dL   Creatinine, Ser 0.85 0.61 - 1.24 mg/dL   Calcium 9.1 8.9 - 10.3 mg/dL   Total Protein 7.1 6.5 - 8.1 g/dL   Albumin 4.1 3.5 - 5.0 g/dL   AST 20  15 - 41 U/L   ALT 15 (L) 17 - 63 U/L   Alkaline Phosphatase 71 38 - 126 U/L   Total Bilirubin 0.8 0.3 - 1.2 mg/dL   GFR calc non Af Amer >60 >60 mL/min   GFR calc Af Amer >60 >60 mL/min    Comment: (NOTE) The eGFR has been calculated using the CKD EPI equation. This calculation has not been validated in all clinical situations. eGFR's persistently <60 mL/min signify possible Chronic Kidney Disease.    Anion gap 9 5 - 15  Glucose, capillary     Status: Abnormal   Collection Time: 07/11/16 12:59 AM  Result Value Ref Range   Glucose-Capillary 171 (H) 65 - 99 mg/dL   Ct Angio Head W Or Wo Contrast  Result Date: 07/11/2016 CLINICAL DATA:  Initial evaluation for acute dizziness, blurry vision. EXAM: CT ANGIOGRAPHY HEAD AND NECK TECHNIQUE: Multidetector CT imaging of the head and neck was performed using the standard protocol during bolus administration of intravenous contrast. Multiplanar CT image reconstructions and MIPs were obtained to evaluate the vascular anatomy. Carotid stenosis measurements (when applicable) are obtained utilizing NASCET criteria, using the distal internal carotid diameter as the denominator. CONTRAST:  75 cc of Isovue 370. COMPARISON:  Prior head CT from earlier same day. FINDINGS: CTA NECK FINDINGS Aortic arch: Visualized aortic arch of normal caliber with normal 3 vessel morphology. Scattered plaque within the arch and about the origin of the great vessels without flow-limiting stenosis. Visualized subclavian arteries are widely patent. Right carotid system: Right common carotid artery patent from its origin to the bifurcation. Extensive calcified plaque about the right bifurcation/ proximal right ICA. Associated stenosis of up to 75-80% by NASCET criteria. Distally, right ICA widely patent to the  skullbase without stenosis, dissection, or occlusion. Left carotid system: Left common carotid artery patent from its origin to the bifurcation without stenosis. Extensive atheromatous plaque about the proximal left ICA. Associated stenosis of approximately 50% by NASCET criteria. Distally, left ICA widely patent to the skullbase without stenosis, dissection, or occlusion. Vertebral arteries: Both of the vertebral arteries arise from the subclavian arteries. Left vertebral artery dominant. Atheromatous plaque at the origin of the vertebral arteries bilaterally with associated moderate stenosis on the left and moderate to severe stenosis on the right. Vertebral arteries otherwise mildly tortuous but widely patent within the neck without stenosis, dissection, or occlusion. Skeleton: No acute osseous abnormality. No worrisome lytic or blastic osseous lesions. Other neck: Visualized soft tissues of the neck demonstrate no acute abnormality. No adenopathy. Thyroid normal. Upper chest: Visualized upper chest unremarkable. Review of the MIP images confirms the above findings CTA HEAD FINDINGS Anterior circulation: Scattered atheromatous plaque throughout the petrous, cavernous, and supraclinoid segments bilaterally with moderate diffuse narrowing. A1 segments patent. Anterior communicating artery normal. Anterior cerebral arteries demonstrate mild atheromatous irregularity but are patent to their distal aspects. M1 segments patent without stenosis or occlusion. MCA bifurcations normal. Short-segment proximal left M2 stenosis noted at the base of the left sylvian fissure (series 8, image 106). MCA branches fairly symmetric and well opacified bilaterally, although do demonstrate multifocal atheromatous irregularity. Posterior circulation: Left vertebral artery dominant. Atheromatous plaque with multifocal moderate to severe left V4 stenoses. Right vertebral artery diminutive and largely terminates in PICA, although a tiny  ascending branch ascends towards the vertebrobasilar junction. Posterior inferior cerebellar arteries themselves are patent. Basilar artery diminutive with multifocal atheromatous irregularity and moderate diffuse narrowing. Superior cerebellar arteries are patent bilaterally. P1 segments are hypoplastic bilaterally.  Prominent bilateral posterior communicating artery is which are largely patent. PCAs are supplied to their distal aspects, although demonstrate multifocal atheromatous irregularity. Venous sinuses: Patent. Anatomic variants: Robust posterior communicating arteries bilaterally with overall diminutive vertebrobasilar system. No aneurysm or vascular malformation. Delayed phase: No pathologic enhancement. Review of the MIP images confirms the above findings IMPRESSION: CTA NECK IMPRESSION: 1. Extensive atheromatous plaque about the carotid bifurcations bilaterally, with associated narrowing of up to 75-80% on the right, and 50% on the left. 2. Atheromatous plaque at the origin of the vertebral arteries bilaterally, with associated moderate stenosis on the left and moderate to severe stenosis on the right. Left vertebral artery is dominant. 3. Tortuosity of the major arterial vasculature of the neck, suggesting chronic underlying hypertension. CTA HEAD IMPRESSION: 1. Negative CTA for emergent large vessel occlusion. 2. Multifocal atheromatous plaque within the dominant left V4 segment with associated moderate to severe stenoses. Diminutive right vertebral artery largely terminates in PICA. Moderate diffuse narrowing of the proximal basilar artery. Overall, the vertebrobasilar system is somewhat diminutive as the PCAs are largely supplied via robust posterior communicating arteries. 3. Extensive atheromatous plaque throughout the intracranial ICAs with moderate diffuse narrowing. Electronically Signed   By: Jeannine Boga M.D.   On: 07/11/2016 03:30   Ct Head Wo Contrast  Result Date:  07/11/2016 CLINICAL DATA:  Dizziness and blurred vision. Similar episode 3 days prior. EXAM: CT HEAD WITHOUT CONTRAST TECHNIQUE: Contiguous axial images were obtained from the base of the skull through the vertex without intravenous contrast. COMPARISON:  Head CT 10/19/2012 FINDINGS: Brain: No evidence of acute infarction, hemorrhage, hydrocephalus, extra-axial collection or mass lesion/mass effect. Stable generalized atrophy and chronic small vessel ischemia. Vascular: Atherosclerosis of skullbase vasculature without hyperdense vessel or abnormal calcification. Skull: Burr holes versus craniotomy right frontal. No acute fracture. Sinuses/Orbits: No fluid levels. Mastoid air cells are clear. Visualized orbits are unremarkable. Other: None. IMPRESSION: 1.  No acute intracranial abnormality. 2. Stable atrophy and chronic small vessel ischemia from prior exam. Electronically Signed   By: Jeb Levering M.D.   On: 07/11/2016 00:59   Ct Angio Neck W And/or Wo Contrast  Result Date: 07/11/2016 CLINICAL DATA:  Initial evaluation for acute dizziness, blurry vision. EXAM: CT ANGIOGRAPHY HEAD AND NECK TECHNIQUE: Multidetector CT imaging of the head and neck was performed using the standard protocol during bolus administration of intravenous contrast. Multiplanar CT image reconstructions and MIPs were obtained to evaluate the vascular anatomy. Carotid stenosis measurements (when applicable) are obtained utilizing NASCET criteria, using the distal internal carotid diameter as the denominator. CONTRAST:  75 cc of Isovue 370. COMPARISON:  Prior head CT from earlier same day. FINDINGS: CTA NECK FINDINGS Aortic arch: Visualized aortic arch of normal caliber with normal 3 vessel morphology. Scattered plaque within the arch and about the origin of the great vessels without flow-limiting stenosis. Visualized subclavian arteries are widely patent. Right carotid system: Right common carotid artery patent from its origin to the  bifurcation. Extensive calcified plaque about the right bifurcation/ proximal right ICA. Associated stenosis of up to 75-80% by NASCET criteria. Distally, right ICA widely patent to the skullbase without stenosis, dissection, or occlusion. Left carotid system: Left common carotid artery patent from its origin to the bifurcation without stenosis. Extensive atheromatous plaque about the proximal left ICA. Associated stenosis of approximately 50% by NASCET criteria. Distally, left ICA widely patent to the skullbase without stenosis, dissection, or occlusion. Vertebral arteries: Both of the vertebral arteries arise from the subclavian arteries. Left  vertebral artery dominant. Atheromatous plaque at the origin of the vertebral arteries bilaterally with associated moderate stenosis on the left and moderate to severe stenosis on the right. Vertebral arteries otherwise mildly tortuous but widely patent within the neck without stenosis, dissection, or occlusion. Skeleton: No acute osseous abnormality. No worrisome lytic or blastic osseous lesions. Other neck: Visualized soft tissues of the neck demonstrate no acute abnormality. No adenopathy. Thyroid normal. Upper chest: Visualized upper chest unremarkable. Review of the MIP images confirms the above findings CTA HEAD FINDINGS Anterior circulation: Scattered atheromatous plaque throughout the petrous, cavernous, and supraclinoid segments bilaterally with moderate diffuse narrowing. A1 segments patent. Anterior communicating artery normal. Anterior cerebral arteries demonstrate mild atheromatous irregularity but are patent to their distal aspects. M1 segments patent without stenosis or occlusion. MCA bifurcations normal. Short-segment proximal left M2 stenosis noted at the base of the left sylvian fissure (series 8, image 106). MCA branches fairly symmetric and well opacified bilaterally, although do demonstrate multifocal atheromatous irregularity. Posterior circulation:  Left vertebral artery dominant. Atheromatous plaque with multifocal moderate to severe left V4 stenoses. Right vertebral artery diminutive and largely terminates in PICA, although a tiny ascending branch ascends towards the vertebrobasilar junction. Posterior inferior cerebellar arteries themselves are patent. Basilar artery diminutive with multifocal atheromatous irregularity and moderate diffuse narrowing. Superior cerebellar arteries are patent bilaterally. P1 segments are hypoplastic bilaterally. Prominent bilateral posterior communicating artery is which are largely patent. PCAs are supplied to their distal aspects, although demonstrate multifocal atheromatous irregularity. Venous sinuses: Patent. Anatomic variants: Robust posterior communicating arteries bilaterally with overall diminutive vertebrobasilar system. No aneurysm or vascular malformation. Delayed phase: No pathologic enhancement. Review of the MIP images confirms the above findings IMPRESSION: CTA NECK IMPRESSION: 1. Extensive atheromatous plaque about the carotid bifurcations bilaterally, with associated narrowing of up to 75-80% on the right, and 50% on the left. 2. Atheromatous plaque at the origin of the vertebral arteries bilaterally, with associated moderate stenosis on the left and moderate to severe stenosis on the right. Left vertebral artery is dominant. 3. Tortuosity of the major arterial vasculature of the neck, suggesting chronic underlying hypertension. CTA HEAD IMPRESSION: 1. Negative CTA for emergent large vessel occlusion. 2. Multifocal atheromatous plaque within the dominant left V4 segment with associated moderate to severe stenoses. Diminutive right vertebral artery largely terminates in PICA. Moderate diffuse narrowing of the proximal basilar artery. Overall, the vertebrobasilar system is somewhat diminutive as the PCAs are largely supplied via robust posterior communicating arteries. 3. Extensive atheromatous plaque throughout  the intracranial ICAs with moderate diffuse narrowing. Electronically Signed   By: Jeannine Boga M.D.   On: 07/11/2016 03:30    Review of Systems  Constitutional: Positive for diaphoresis. Negative for chills and fever.  HENT: Negative for sore throat and tinnitus.   Eyes: Negative for blurred vision and redness.  Respiratory: Negative for cough and shortness of breath.   Cardiovascular: Negative for chest pain, palpitations, orthopnea and PND.  Gastrointestinal: Negative for abdominal pain, diarrhea, nausea and vomiting.  Genitourinary: Negative for dysuria, frequency and urgency.  Musculoskeletal: Negative for joint pain and myalgias.  Skin: Negative for rash.       No lesions  Neurological: Positive for dizziness. Negative for speech change, focal weakness and weakness.  Endo/Heme/Allergies: Does not bruise/bleed easily.       No temperature intolerance  Psychiatric/Behavioral: Negative for depression and suicidal ideas.    Blood pressure (!) 152/85, pulse 88, temperature 98.2 F (36.8 C), resp. rate 15, height 6' 6" (  1.981 m), weight 81.6 kg (180 lb), SpO2 98 %. Physical Exam  Constitutional: He is oriented to person, place, and time. He appears well-developed and well-nourished. No distress.  HENT:  Head: Normocephalic and atraumatic.  Mouth/Throat: Oropharynx is clear and moist.  Eyes: Conjunctivae and EOM are normal. Pupils are equal, round, and reactive to light. No scleral icterus.  Neck: Normal range of motion. Neck supple. No JVD present. No tracheal deviation present. No thyromegaly present.  Cardiovascular: Normal rate, regular rhythm and normal heart sounds.  Exam reveals no gallop and no friction rub.   No murmur heard. Respiratory: Effort normal and breath sounds normal. No respiratory distress.  GI: Soft. Bowel sounds are normal. He exhibits no distension.  Genitourinary:  Genitourinary Comments: Deferred  Musculoskeletal: Normal range of motion. He exhibits  no edema.  Lymphadenopathy:    He has no cervical adenopathy.  Neurological: He is alert and oriented to person, place, and time. No cranial nerve deficit.  Skin: Skin is warm and dry. No rash noted. No erythema.  Psychiatric: He has a normal mood and affect. His behavior is normal. Judgment and thought content normal.     Assessment/Plan This is a 76 year old male admitted for vertebrobasilar insufficiency. 1. Dizziness: Secondary to vertebrobasilar insufficiency. The patient has no neurologic deficits. His dizziness has mildly improved as he lies flat in bed. He mentions that he has some eye pathology that will require surgery that may be exacerbating his sensations of dizziness as it improves when he closes his right eye. Obtain MRI of the brain and echocardiogram. Neurology has been consulted. 2. Coronary artery disease: Stable; continue aspirin and Plavix. 3. Hypertension: Less than optimal control although some permissive hypertension is good for his current condition. Continue amlodipine, ACE inhibitor and long-acting metoprolol. 4. Diabetes mellitus type II: Hold oral hypoglycemic agents; continue basal insulin just for hospital diet. Sliding scale insulin while hospitalized. 5. BPH: Continue Proscar 6. DVT prophylaxis: Lovenox 7. GI prophylaxis: Pantoprazole per home regimen The patient is a full code. Time spent on admission orders and patient care approximately 45 minutes  Harrie Foreman, MD 07/11/2016, 7:18 AM

## 2016-07-11 NOTE — ED Triage Notes (Addendum)
Pt says he woke about 30 minutes ago and felt like his heart was "beating real fast"; then he says it felt like the room was spinning; pt says he had full field of vision but it was blurry; currently feeling a little lightheaded and vision has cleared; pt with history of TIA; talking in complete coherent sentences

## 2016-07-11 NOTE — ED Notes (Signed)
Pt updated on admission status, pt verbalizes understanding that he is not transferring to [facility] at this time.

## 2016-07-11 NOTE — ED Notes (Signed)
Pt transported by this RN to Room 122. RN to receive patient called and informed patient on way to room

## 2016-07-11 NOTE — ED Triage Notes (Signed)
Patient woke up about 30 minutes ago and that he was feeling dizzy. Patient states that his FSBS was 187.

## 2016-07-11 NOTE — ED Notes (Signed)
Was told by ed sec that pt can not go to floor at this time because "the floor is in protected time, they called and wanted you to know."

## 2016-07-11 NOTE — ED Notes (Signed)
Patient transported to CT 

## 2016-07-11 NOTE — ED Provider Notes (Signed)
[facility] Emergency Department Provider Note   ____________________________________________   First MD Initiated Contact with Patient 07/11/16 [phone]     (approximate)  I have reviewed the triage vital signs and the nursing notes.   HISTORY  Chief Complaint Dizziness    HPI Jeremy Braun is a 76 y.o. male who comes into the hospital today with dizziness and lightheadedness as well as blurred vision. He reports that the symptoms started around midnight. The patient was laying in bed half asleep when it started. He states that the room was spinning and he felt lightheaded. The patient denies any difficulty with speech and denies any facial numbness. He has no weakness or numbness anywhere else. The patient denies any chest pain or shortness of breath. He states that he has had dizziness in the past but he says it's been a long time. He reports that when he would get up he would feel lightheaded and feel like the room spinning. He has been eating well and he denies any nausea vomiting or diarrhea. The patient is abdominal pain.   Past Medical History:  Diagnosis Date  . Arthritis    knees, shoulder arms  . Carotid stenosis    a. 07/2012 U/S: < 50% bilat stenosis - Followed by Dr. Lucky Cowboy.  . Coronary artery disease    a. s/p CABG;  b. 07/2012 Myoview sm region of mildly dec perfusion in the basal to mid inf wall w/o signif ischemia.  EF 60%.  . DM II (diabetes mellitus, type II), controlled ([place])   . GERD (gastroesophageal reflux disease)   . History of prostate cancer   . Hyperlipidemia   . Hypertension   . Rectal cancer ([place]) 2001   Local resection.  . Scoliosis   . Sinus drainage   . TIA (transient ischemic attack)    several yrs ago - no deficits  . Weakness of both legs   . Wears dentures    full upper    Patient Active Problem List   Diagnosis Date Noted  . 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 Encounter for screening  colonoscopy 09/30/2014  . Coronary artery disease   . Hypertension   . Hyperlipidemia   . DM II (diabetes mellitus, type II), controlled ([place])   . Carotid stenosis     Past Surgical History:  Procedure Laterality Date  . CARDIAC CATHETERIZATION     before CABG  . COLONOSCOPY  09/29/2009  . CORONARY ARTERY BYPASS GRAFT  2014   [facility]  . EYE SURGERY Right 2015   Inplant, Dr Lita Mains  . INGUINAL HERNIA REPAIR     multiple  . PROSTATE BIOPSY    . SHOULDER SURGERY Left     Prior to Admission medications   Medication Sig Start Date End Date Taking? Authorizing Provider  amLODipine (NORVASC) 10 MG tablet Take 10 mg by mouth daily. AM   Yes Historical Provider, MD  aspirin 81 MG tablet Take 81 mg by mouth daily. AM   Yes Historical Provider, MD  benazepril (LOTENSIN) 40 MG tablet Take 40 mg by mouth 2 (two) times daily.   Yes Historical Provider, MD  clopidogrel (PLAVIX) 75 MG tablet Take 75 mg by mouth daily. AM   Yes Historical Provider, MD  Cyanocobalamin (VITAMIN B-12 IJ) Inject as directed every 30 (thirty) days.   Yes Historical Provider, MD  Cyanocobalamin (VITAMIN B-12) 5000 MCG LOZG Take 1 tablet by mouth daily. AM   Yes Historical Provider, MD  finasteride (  PROSCAR) 5 MG tablet Take 5 mg by mouth daily. AM   Yes Historical Provider, MD  fluticasone (FLONASE) 50 MCG/ACT nasal spray Place into both nostrils 2 (two) times daily.   Yes Historical Provider, MD  glipiZIDE (GLUCOTROL) 10 MG tablet Take 10 mg by mouth 2 (two) times daily before a meal.   Yes Historical Provider, MD  insulin glargine (LANTUS) 100 UNIT/ML injection Inject 8 Units into the skin at bedtime.    Yes Historical Provider, MD  lovastatin (MEVACOR) 40 MG tablet Take 40 mg by mouth at bedtime.   Yes Historical Provider, MD  metFORMIN (GLUCOPHAGE) 1000 MG tablet Takes 1000 mg am and 1500 mg pm daily.   Yes Historical Provider, MD  metoprolol succinate (TOPROL-XL) 100 MG 24 hr tablet Take 100 mg by mouth daily. Take with  or immediately following a meal.   Yes Historical Provider, MD  Multiple Vitamin (MULTIVITAMIN) capsule Take 1 capsule by mouth daily.   Yes Historical Provider, MD  pantoprazole (PROTONIX) 40 MG tablet Take 40 mg by mouth daily. AM   Yes Historical Provider, MD  pioglitazone (ACTOS) 45 MG tablet Take 45 mg by mouth daily.   Yes Historical Provider, MD  Vitamin D, Cholecalciferol, 1000 UNITS CAPS Take 1 capsule by mouth daily.   Yes Historical Provider, MD  polyethylene glycol powder (GLYCOLAX/MIRALAX) powder 255 grams one bottle for colonoscopy prep 10/01/14   [name], MD    Allergies Hctz [hydrochlorothiazide]; Hydralazine; Reserpine; and Catapres [clonidine hcl]  Family History  Problem Relation Age of Onset  . Heart disease Mother   . Heart disease Brother     Social History Social History  Substance Use Topics  . Smoking status: Never Smoker  . Smokeless tobacco: Not on file  . Alcohol use No    Review of Systems Constitutional: No fever/chills Eyes: No visual changes. ENT: No sore throat. Cardiovascular: Denies chest pain. Respiratory: Denies shortness of breath. Gastrointestinal: No abdominal pain.  No nausea, no vomiting.  No diarrhea.  No constipation. Genitourinary: Negative for dysuria. Musculoskeletal: Negative for back pain. Skin: Negative for rash. Neurological: Dizziness  10-point ROS otherwise negative.  ____________________________________________   PHYSICAL EXAM:  VITAL SIGNS: ED Triage Vitals  Enc Vitals Group     BP 07/11/16 0033 (!) 188/85     Pulse Rate 07/11/16 0033 79     Resp 07/11/16 0033 18     Temp 07/11/16 0033 98.2 F (36.8 C)     Temp Source 07/11/16 0033 Oral     SpO2 07/11/16 0033 100 %     Weight 07/11/16 0035 180 lb (81.6 kg)     Height 07/11/16 0035 6\' 6"  (1.981 m)     Head Circumference --      Peak Flow --      Pain Score 07/11/16 0036 0     Pain Loc --      Pain Edu? --      Excl. in [place]? --      Constitutional: Alert and oriented. Well appearing and in Mild distress. Eyes: Conjunctivae are normal. PERRL. EOMI. Head: Atraumatic. Nose: No congestion/rhinnorhea. Mouth/Throat: Mucous membranes are moist.  Oropharynx non-erythematous. Cardiovascular: Normal rate, regular rhythm. Grossly normal heart sounds.  Good peripheral circulation. Respiratory: Normal respiratory effort.  No retractions. Lungs CTAB. Gastrointestinal: Soft and nontender. No distention. Positive bowel sounds Musculoskeletal: No lower extremity tenderness nor edema.   Neurologic:  Normal speech and language. Cranial nerves II through XII are grossly intact with  no focal motor or neuro deficits. Patient has 5 out of 5 strength in his upper and lower extremities, finger to nose intact and rapid alternating movement is intact. Skin:  Skin is warm, dry and intact.  Psychiatric: Mood and affect are normal.   ____________________________________________   LABS (all labs ordered are listed, but only abnormal results are displayed)  Labs Reviewed  CBC WITH DIFFERENTIAL/PLATELET - Abnormal; Notable for the following:       Result Value   RBC 4.16 (*)    Hemoglobin 12.7 (*)    HCT 38.3 (*)    All other components within normal limits  COMPREHENSIVE METABOLIC PANEL - Abnormal; Notable for the following:    Glucose, Bld 183 (*)    ALT 15 (*)    All other components within normal limits  GLUCOSE, CAPILLARY - Abnormal; Notable for the following:    Glucose-Capillary 171 (*)    All other components within normal limits  TROPONIN I   ____________________________________________  EKG  ED ECG REPORT I, Loney Hering, the attending physician, personally viewed and interpreted this ECG.   Date: 07/11/2016  EKG Time: 0037  Rate: 71  Rhythm: normal sinus rhythm  Axis: normal  Intervals:none  ST&T Change: none  ____________________________________________  RADIOLOGY  CT  head ____________________________________________   PROCEDURES  Procedure(s) performed: None  Procedures  Critical Care performed: No  ____________________________________________   INITIAL IMPRESSION / ASSESSMENT AND PLAN / ED COURSE  Pertinent labs & imaging results that were available during my care of the patient were reviewed by me and considered in my medical decision making (see chart for details).  This is a 76 year old male who comes into the hospital today with some dizziness and lightheadedness. He reports it started suddenly at midnight. The patient's neuro exam is intact at this time. My concern is he may have some posterior circulation difficulty. I did do a CT scan of his head as well as CT angiogram of his head and neck. The patient's blood work at this time is unremarkable. He will be reassessed.  Clinical Course as of Jul 12 611  Mon Jul 11, 2016  0117 1. No acute intracranial abnormality. 2. Stable atrophy and chronic small vessel ischemia from prior exam.   CT Head Wo Contrast [AW]  0334 1. Extensive atheromatous plaque about the carotid bifurcations bilaterally, with associated narrowing of up to 75-80% on the right, and 50% on the left. 2. Atheromatous plaque at the origin of the vertebral arteries bilaterally, with associated moderate stenosis on the left and moderate to severe stenosis on the right. Left vertebral artery is dominant. 3. Tortuosity of the major arterial vasculature of the neck, suggesting chronic underlying hypertension.   CT Angio Neck W and/or Wo Contrast [AW]  0335 1. Negative CTA for emergent large vessel occlusion. 2. Multifocal atheromatous plaque within the dominant left V4 segment with associated moderate to severe stenoses. Diminutive right vertebral artery largely terminates in PICA. Moderate diffuse narrowing of the proximal basilar artery. Overall, the vertebrobasilar system is somewhat diminutive as the PCAs  are largely supplied via robust posterior communicating arteries. 3. Extensive atheromatous plaque throughout the intracranial ICAs with moderate diffuse narrowing.   CT Head Wo Contrast [AW]    Clinical Course User Index [AW] Loney Hering, MD    The patient's CT scan is significant for vertebrobasilar insufficiency. The patient does have some left carotid artery stenosis but he also has some diminutive right vertebral artery and some narrowing  of the proximal basilar artery with some diminutive cheaper basilar system. I will admit the patient to the hospitalist service for further evaluation of these symptoms. I feel that this may be the cause of the patient's dizziness tonight. I did initially attempt to contact [facility] for possible intervascular intervention but they report that it does not need to be done acutely. The patient will be admitted for further evaluation of his vertebrobasilar insufficiency. ____________________________________________   FINAL CLINICAL IMPRESSION(S) / ED DIAGNOSES  Final diagnoses:  Dizziness  Vertebrobasilar insufficiency      NEW MEDICATIONS STARTED DURING THIS VISIT:  New Prescriptions   No medications on file     Note:  This document was prepared using Dragon voice recognition software and may include unintentional dictation errors.    Loney Hering, MD 07/11/16 [phone]

## 2016-07-12 DIAGNOSIS — I2510 Atherosclerotic heart disease of native coronary artery without angina pectoris: Secondary | ICD-10-CM | POA: Diagnosis not present

## 2016-07-12 DIAGNOSIS — G450 Vertebro-basilar artery syndrome: Secondary | ICD-10-CM | POA: Diagnosis not present

## 2016-07-12 DIAGNOSIS — I10 Essential (primary) hypertension: Secondary | ICD-10-CM | POA: Diagnosis not present

## 2016-07-12 DIAGNOSIS — E119 Type 2 diabetes mellitus without complications: Secondary | ICD-10-CM | POA: Diagnosis not present

## 2016-07-12 LAB — HEMOGLOBIN A1C
Hgb A1c MFr Bld: 7.7 % — ABNORMAL HIGH (ref 4.8–5.6)
Mean Plasma Glucose: 174 mg/dL

## 2016-07-12 LAB — GLUCOSE, CAPILLARY
Glucose-Capillary: 192 mg/dL — ABNORMAL HIGH (ref 65–99)
Glucose-Capillary: 263 mg/dL — ABNORMAL HIGH (ref 65–99)
Glucose-Capillary: 108 mg/dL — ABNORMAL HIGH (ref 65–99)
Glucose-Capillary: 192 mg/dL — ABNORMAL HIGH (ref 65–99)

## 2016-07-12 MED ORDER — INSULIN GLARGINE 100 UNIT/ML ~~LOC~~ SOLN
8.0000 [IU] | Freq: Every day | SUBCUTANEOUS | Status: DC
  Administered 2016-07-12: 21:00:00 8 [IU] via SUBCUTANEOUS
  Filled 2016-07-12 (×2): qty 0.08

## 2016-07-12 NOTE — Progress Notes (Signed)
Occupational Therapy Treatment Patient Details Name: PANFILO KETCHUM MRN: [id number] DOB: 1941/05/31 Today's Date: 07/12/2016    History of present illness Pt is a 76yo male with PMHx of carotid artery stenosis, coronary artery disease s/p CABG and TIAs who presented to the ED with c/o dizziness. He denies nausea or vomiting, chest pain or shortness of breath. In the ED the pt continued to feel dizzy but the sensation subsided in severity. Head CTA confirmed severe carotid artery stenosis on the left as well as vertebrobasilar insufficiency.    OT comments  Pt tolerated session well today. Pt met in hallway, ambulating independently around floor and on his way back to his room at start of OT session. No dizziness, SOB, or LOB noted. Pt and family member (daughter) educated in ECS (handout provided), pursed lip breathing, home modifications, and AE for LB ADL tasks to support functional independence with ADL and minimize falls risk. Pt grateful for education. All education provided, pt has met all OT goals and is ready to be d/c'd from OT services for this hospitalization. OT will sign off.    Follow Up Recommendations  No OT follow up    Equipment Recommendations  Tub/shower seat    Recommendations for Other Services      Precautions / Restrictions Precautions Precautions: Fall Restrictions Weight Bearing Restrictions: No       Mobility Bed Mobility Overal bed mobility: Independent             General bed mobility comments: independent with all mobility. Safe technique performed  Transfers Overall transfer level: Independent Equipment used: None             General transfer comment: pt performed sup<>sit and sit<>stand transfers independently, no dizziness noted    Balance Overall balance assessment: No apparent balance deficits (not formally assessed)                                 ADL Overall ADL's : At baseline                                        General ADL Comments: Pt educated in A/E for LB ADL as well as energy conservation strategies to support safety, functional independence, and minimizing falls risk      Vision                     Perception     Praxis      Cognition   Behavior During Therapy: WFL for tasks assessed/performed Overall Cognitive Status: Within Functional Limits for tasks assessed                       Extremity/Trunk Assessment               Exercises Other Exercises Other Exercises: Pt and family member educated in ECS and AE for LB ADL, home and routine modifications to support function at home and minimize falls risk Other Exercises: Pt and family member educated in benefits of mobility/engaging in activities to maintain energy and muscle strength and minimize functional decline; OT reiterated and encouraged pt to utilize [facility] PT for HEP   Shoulder Instructions       General Comments      Pertinent Vitals/ Pain  Pain Assessment: No/denies pain  Home Living                                          Prior Functioning/Environment              Frequency           Progress Toward Goals  OT Goals(current goals can now be found in the care plan section)  Progress towards OT goals: Goals met/education completed, patient discharged from OT  Acute Rehab OT Goals Patient Stated Goal: go home  Plan Discharge plan remains appropriate;All goals met and education completed, patient discharged from OT services    Co-evaluation                 End of Session     Activity Tolerance Patient tolerated treatment well   Patient Left in bed;with call bell/phone within reach;with family/visitor present   Nurse Communication      Functional Limitation: Self care Self Care Current Status (G8987): 0 percent impaired, limited or restricted Self Care Goal Status (G8988): 0 percent impaired, limited or  restricted Self Care Discharge Status (G8989): 0 percent impaired, limited or restricted   Time: 1005-1041 OT Time Calculation (min): 36 min  Charges: OT G-codes **NOT FOR INPATIENT CLASS** Functional Limitation: Self care Self Care Current Status (G8987): 0 percent impaired, limited or restricted Self Care Goal Status (G8988): 0 percent impaired, limited or restricted Self Care Discharge Status (G8989): 0 percent impaired, limited or restricted OT General Charges $OT Visit: 1 Procedure OT Treatments $Self Care/Home Management : 23-37 mins  Jamie L Stiller, OTR/L 07/12/2016, 10:58 AM    

## 2016-07-12 NOTE — Progress Notes (Signed)
[place] [facility] [place] NAME: Jeremy Braun    MR#:  [id number]  DATE OF BIRTH:  [birth year]  SUBJECTIVE:  CHIEF COMPLAINT:   Chief Complaint  Patient presents with  . Dizziness   No complaint. REVIEW OF SYSTEMS:  Review of Systems  Constitutional: Negative for chills and fever.  HENT: Negative for congestion.   Eyes: Negative for blurred vision and double vision.  Respiratory: Negative for cough, shortness of breath and stridor.   Cardiovascular: Negative for chest pain and leg swelling.  Gastrointestinal: Negative for abdominal pain, blood in stool, diarrhea, melena, nausea and vomiting.  Genitourinary: Negative for dysuria and hematuria.  Musculoskeletal: Negative for joint pain.  Neurological: Negative for dizziness, focal weakness, loss of consciousness and weakness.  Psychiatric/Behavioral: Negative for depression. The patient is not nervous/anxious.     DRUG ALLERGIES:   Allergies  Allergen Reactions  . Hctz [Hydrochlorothiazide] Other (See Comments)    Hyponatremia   . Hydralazine Other (See Comments)    hyponatremia  . Reserpine Other (See Comments)    hyponatremia  . Catapres [Clonidine Hcl] Rash   VITALS:  Blood pressure (!) 152/67, pulse 62, temperature 97.9 F (36.6 C), temperature source Oral, resp. rate 16, height 6' 5.5" (1.969 m), weight 179 lb 3.2 oz (81.3 kg), SpO2 100 %. PHYSICAL EXAMINATION:  Physical Exam  Constitutional: He is oriented to person, place, and time and well-developed, well-nourished, and in no distress.  HENT:  Head: Normocephalic.  Eyes: Conjunctivae and EOM are normal.  Neck: Normal range of motion. Neck supple. No JVD present. No tracheal deviation present.  Cardiovascular: Normal rate, regular rhythm and normal heart sounds.  Exam reveals no gallop.   No murmur heard. Pulmonary/Chest: Effort normal and breath sounds normal. No respiratory distress. He has no wheezes.  Abdominal:  Soft. Bowel sounds are normal. He exhibits no distension. There is no tenderness.  Musculoskeletal: Normal range of motion. He exhibits no edema or tenderness.  Neurological: He is alert and oriented to person, place, and time. No cranial nerve deficit.  Skin: No rash noted. No erythema.  Psychiatric: Affect normal.   LABORATORY PANEL:   CBC  Recent Labs Lab 07/11/16 0056  WBC 6.0  HGB 12.7*  HCT 38.3*  PLT 166   ------------------------------------------------------------------------------------------------------------------ Chemistries   Recent Labs Lab 07/11/16 0056  NA 135  K 3.5  CL 103  CO2 23  GLUCOSE 183*  BUN 14  CREATININE 0.85  CALCIUM 9.1  AST 20  ALT 15*  ALKPHOS 71  BILITOT 0.8   RADIOLOGY:  No results found. ASSESSMENT AND PLAN:   This is a 76 year old male admitted for vertebrobasilar insufficiency.  1. Dizziness: Secondary to vertebrobasilar insufficiency.   CTA of the head actually cranial vessels confirmed severe carotid artery stenosis on the left as well as vertebrobasilar insufficiency.  No CVA per MRI of the brain and f/u echocardiogram. Follow-up Neurology consult.  2. Coronary artery disease: Stable; continue aspirin and Plavix.  3. Hypertension: Continue amlodipine, ACE inhibitor and long-acting metoprolol.  4. Diabetes mellitus type II: Hold oral hypoglycemic agents; continue basal insulin and Sliding scale insulin.  5. BPH: Continue Proscar  PT evaluation suggested home health with PT.  I discussed with Dr. Doy Mince.  All the records are reviewed and case discussed with Care Management/Social Worker. Management plans discussed with the patient, family and they are in agreement.  CODE STATUS: Full code  TOTAL TIME TAKING CARE OF THIS PATIENT: 20  minutes.   More than 50% of the time was spent in counseling/coordination of care: YES  POSSIBLE D/C IN 1 DAYS, DEPENDING ON CLINICAL CONDITION.   Demetrios Loll M.D on 07/12/2016 at  6:24 PM  Between 7am to 6pm - Pager - [phone]  After 6pm go to [email or web address] - Proofreader  [facility]  Office  [phone]  CC: Primary care physician; Rusty Aus, MD  Note: This dictation was prepared with Dragon dictation along with smaller phrase technology. Any transcriptional errors that result from this process are unintentional.

## 2016-07-12 NOTE — Care Management (Signed)
Admitted to [facility] with the diagnosis of vertebrobasilar under observation status. Lives with Rushie Nyhan [phone]). Seen Dr. Emily Filbert last month. No home health. No skilled facility. No home oxygen. Uses no equipment to aid in ambulation. No falls. Good appetite. Takes care of all basic activities of daily living himself, drives. Prescriptions are filled per mail order or [address] Family will transport.    Physical therapy evaluation completed. Recommends home with home health/physical therapy. [place]. Floydene Flock, [facility] representative updated. Discussed shower chair could possible be purchased at [employer].                                                                                                                              Shelbie Ammons RN MSN CCM Care Management

## 2016-07-12 NOTE — Progress Notes (Signed)
Physical Therapy Treatment Patient Details Name: Jeremy Braun MRN: [id number] DOB: Sep 15, 1940 Today's Date: 07/12/2016    History of Present Illness Pt is a 76yo male with PMHx of carotid artery stenosis, coronary artery disease s/p CABG and TIAs who presented to the ED with c/o dizziness. He denies nausea or vomiting, chest pain or shortness of breath. In the ED the pt continued to feel dizzy but the sensation subsided in severity. Head CTA confirmed severe carotid artery stenosis on the left as well as vertebrobasilar insufficiency.     PT Comments    Pt is making good progress towards goals with increased mobility this date. Pt demonstrates fair balance during ambulation with unsteadiness noted with head turns and directional changes. No use of AD at this time. Would benefit from higher level physical therapy to improve balance/decrease fall risk. MRI testing negative, changed frequency of PT interventions. Educated pt on benefits of mobility as pt reports he wants to stay in bed most of the day. Will continue to progress.   Follow Up Recommendations  Home health PT     Equipment Recommendations   (pt requesting shower chair, CM notified)    Recommendations for Other Services       Precautions / Restrictions Precautions Precautions: Fall Restrictions Weight Bearing Restrictions: No    Mobility  Bed Mobility Overal bed mobility: Independent             General bed mobility comments: independent with all mobility. Safe technique performed  Transfers Overall transfer level: Independent Equipment used: None             General transfer comment: transfers performed with upright posture. No dizziness noted with transfer  Ambulation/Gait Ambulation/Gait assistance: Supervision Ambulation Distance (Feet): 200 Feet Assistive device: None Gait Pattern/deviations: Step-through pattern     General Gait Details: ambulated around RN station with safe technique.  Good speed and no formal LOB noted. Slight unsteadiness noted with turns with cues for guidance.   Stairs            Wheelchair Mobility    Modified Rankin (Stroke Patients Only)       Balance                                    Cognition Arousal/Alertness: Awake/alert Behavior During Therapy: WFL for tasks assessed/performed Overall Cognitive Status: Within Functional Limits for tasks assessed                      Exercises Other Exercises Other Exercises: standing ther-ex performed at sink with occasional B UE support. THer-ex includes heel raises, mini squats, and alt. marching. All ther-ex performed x 10 reps with cga.  Other Exercises: educated family and pt on benefits of mobility and HEP for home use.    General Comments        Pertinent Vitals/Pain Pain Assessment: No/denies pain    Home Living                      Prior Function            PT Goals (current goals can now be found in the care plan section) Acute Rehab PT Goals Patient Stated Goal: go home PT Goal Formulation: With patient Time For Goal Achievement: 07/25/16 Potential to Achieve Goals: Good Progress towards PT goals: Progressing toward goals    Frequency  Min 2X/week      PT Plan Frequency needs to be updated    Co-evaluation             End of Session Equipment Utilized During Treatment: Gait belt Activity Tolerance: Patient tolerated treatment well Patient left: in bed (refused to sit in recliner this date)     Time: [id number] PT Time Calculation (min) (ACUTE ONLY): 23 min  Charges:  $Gait Training: 8-22 mins $Therapeutic Exercise: 8-22 mins                    G Codes:      [name] 31-Jul-2016, 9:43 AM  [name], PT, DPT [phone]

## 2016-07-12 NOTE — Care Management Obs Status (Signed)
[facility] NOTIFICATION   Patient Details  Name: JAKEITH DEMONT MRN: [id number] Date of Birth: Aug 23, 1940   Medicare Observation Status Notification Given:  Yes    Shelbie Ammons, RN 07/12/2016, 10:06 AM

## 2016-07-13 DIAGNOSIS — E119 Type 2 diabetes mellitus without complications: Secondary | ICD-10-CM | POA: Diagnosis not present

## 2016-07-13 DIAGNOSIS — I10 Essential (primary) hypertension: Secondary | ICD-10-CM | POA: Diagnosis not present

## 2016-07-13 DIAGNOSIS — G450 Vertebro-basilar artery syndrome: Secondary | ICD-10-CM

## 2016-07-13 DIAGNOSIS — I2510 Atherosclerotic heart disease of native coronary artery without angina pectoris: Secondary | ICD-10-CM | POA: Diagnosis not present

## 2016-07-13 LAB — ECHOCARDIOGRAM COMPLETE
E decel time: 299 msec
E/e' ratio: 6.14
FS: 45 % — AB (ref 28–44)
Height: 77.5 in
IVS/LV PW RATIO, ED: 1.05
LA ID, A-P, ES: 49 mm
LA diam end sys: 49 mm
LA diam index: 2.36 cm/m2
LA vol A4C: 60.1 ml
LA vol index: 28.8 mL/m2
LA vol: 59.9 mL
LV E/e' medial: 6.14
LV E/e'average: 6.14
LV PW d: 10.1 mm — AB (ref 0.6–1.1)
LV e' LATERAL: 11.3 cm/s
Lateral S' vel: 8.92 cm/s
MV Dec: 299
MV pk A vel: 116 m/s
MV pk E vel: 69.4 m/s
P 1/2 time: 1198 ms
TDI e' lateral: 11.3
TDI e' medial: 3.59
Weight: 2819.2 oz

## 2016-07-13 LAB — GLUCOSE, CAPILLARY
Glucose-Capillary: 199 mg/dL — ABNORMAL HIGH (ref 65–99)
Glucose-Capillary: 236 mg/dL — ABNORMAL HIGH (ref 65–99)

## 2016-07-13 NOTE — Consult Note (Signed)
Referring Physician: Bridgett Larsson    Chief Complaint: Dizziness  HPI: Jeremy Braun is an 76 y.o. male with a history of carotid stenosis who reports awakening on 1/29 and feeling like the "room was spinning".  He called his son for help to the bathroom where he placed a cold towel on his head to cool off as he felt hot and sweaty. He denies nausea or vomiting, chest pain or shortness of breath. In the emergency department the patient continued to feel dizzy but the sensation gradually subsided.  Initial NIHSS of 0.    Date last known well: Date: 07/10/2016 Time last known well: Time: 21:00 tPA Given: No: Resolution of symptoms  Past Medical History:  Diagnosis Date  . Arthritis    knees, shoulder arms  . Carotid stenosis    a. 07/2012 U/S: < 50% bilat stenosis - Followed by Dr. Lucky Cowboy.  . Coronary artery disease    a. s/p CABG;  b. 07/2012 Myoview sm region of mildly dec perfusion in the basal to mid inf wall w/o signif ischemia.  EF 60%.  . DM II (diabetes mellitus, type II), controlled ([place])   . GERD (gastroesophageal reflux disease)   . History of prostate cancer   . Hyperlipidemia   . Hypertension   . Rectal cancer ([place]) 2001   Local resection.  . Scoliosis   . Sinus drainage   . TIA (transient ischemic attack)    several yrs ago - no deficits  . Weakness of both legs   . Wears dentures    full upper    Past Surgical History:  Procedure Laterality Date  . CARDIAC CATHETERIZATION     before CABG  . COLONOSCOPY  09/29/2009  . CORONARY ARTERY BYPASS GRAFT  2014   [facility]  . EYE SURGERY Right 2015   Inplant, Dr Lita Mains  . INGUINAL HERNIA REPAIR     multiple  . PROSTATE BIOPSY    . SHOULDER SURGERY Left     Family History  Problem Relation Age of Onset  . Heart disease Mother   . Heart disease Brother    Social History:  reports that he has never smoked. He has never used smokeless tobacco. He reports that he does not drink alcohol or use drugs.  Allergies:  Allergies   Allergen Reactions  . Hctz [Hydrochlorothiazide] Other (See Comments)    Hyponatremia   . Hydralazine Other (See Comments)    hyponatremia  . Reserpine Other (See Comments)    hyponatremia  . Catapres [Clonidine Hcl] Rash    Medications:  I have reviewed the patient's current medications. Prior to Admission:  No prescriptions prior to admission.   Scheduled: . amLODipine  10 mg Oral Daily  . aspirin  81 mg Oral Daily  . benazepril  40 mg Oral BID  . cholecalciferol  1,000 Units Oral Daily  . clopidogrel  75 mg Oral Daily  . docusate sodium  100 mg Oral BID  . enoxaparin (LOVENOX) injection  40 mg Subcutaneous Q24H  . finasteride  5 mg Oral Daily  . fluticasone  1 spray Each Nare BID  . insulin aspart  0-15 Units Subcutaneous TID WC  . insulin glargine  8 Units Subcutaneous QHS  . metoprolol succinate  100 mg Oral Daily  . multivitamin with minerals  1 tablet Oral Daily  . pantoprazole  40 mg Oral Daily  . pravastatin  40 mg Oral q1800  . vitamin B-12  5,000 mcg Oral Daily  ROS: History obtained from the patient  General ROS: negative for - chills, fatigue, fever, night sweats, weight gain or weight loss Psychological ROS: negative for - behavioral disorder, hallucinations, memory difficulties, mood swings or suicidal ideation Ophthalmic ROS: negative for - blurry vision, double vision, eye pain or loss of vision ENT ROS: negative for - epistaxis, nasal discharge, oral lesions, sore throat, tinnitus or vertigo Allergy and Immunology ROS: negative for - hives or itchy/watery eyes Hematological and Lymphatic ROS: negative for - bleeding problems, bruising or swollen lymph nodes Endocrine ROS: negative for - galactorrhea, hair pattern changes, polydipsia/polyuria or temperature intolerance Respiratory ROS: negative for - cough, hemoptysis, shortness of breath or wheezing Cardiovascular ROS: negative for - chest pain, dyspnea on exertion, edema or irregular  heartbeat Gastrointestinal ROS: negative for - abdominal pain, diarrhea, hematemesis, nausea/vomiting or stool incontinence Genito-Urinary ROS: negative for - dysuria, hematuria, incontinence or urinary frequency/urgency Musculoskeletal ROS: leg weakness Neurological ROS: as noted in HPI Dermatological ROS: negative for rash and skin lesion changes  Physical Examination: Blood pressure (!) 152/74, pulse 66, temperature 97.6 F (36.4 C), resp. rate 20, height 6' 5.5" (1.969 m), weight 81.3 kg (179 lb 4.8 oz), SpO2 100 %.  HEENT-  Normocephalic, no lesions, without obvious abnormality.  Normal external eye and conjunctiva.  Normal TM's bilaterally.  Normal auditory canals and external ears. Normal external nose, mucus membranes and septum.  Normal pharynx. Cardiovascular- S1, S2 normal, pulses palpable throughout   Lungs- chest clear, no wheezing, rales, normal symmetric air entry Abdomen- soft, non-tender; bowel sounds normal; no masses,  no organomegaly Extremities- no edema Lymph-no adenopathy palpable Musculoskeletal-no joint tenderness, deformity or swelling Skin-warm and dry, no hyperpigmentation, vitiligo, or suspicious lesions  Neurological Examination Mental Status: Alert, oriented, thought content appropriate.  Speech fluent without evidence of aphasia.  Able to follow 3 step commands without difficulty. Cranial Nerves: II: Discs flat bilaterally; Visual fields grossly normal, pupils equal, round, reactive to light and accommodation III,IV, VI: ptosis not present, extra-ocular motions intact bilaterally V,VII: smile symmetric, facial light touch sensation normal bilaterally VIII: hearing normal bilaterally IX,X: gag reflex present XI: bilateral shoulder shrug XII: midline tongue extension Motor: Right : Upper extremity   5/5    Left:     Upper extremity   5/5  Lower extremity   5/5     Lower extremity   5/5 Tone and bulk:normal tone throughout; no atrophy noted Sensory:  Pinprick and light touch intact throughout, bilaterally Deep Tendon Reflexes: 2+ in the upper extremities, 1+ at the knees and absent at the ankles Plantars: Right: mute   Left: mute Cerebellar: Normal finger-to-nose and normal heel-to-shin testing bilaterally Gait: not tested due to safety concerns    Laboratory Studies:  Basic Metabolic Panel:  Recent Labs Lab 07/11/16 0056  NA 135  K 3.5  CL 103  CO2 23  GLUCOSE 183*  BUN 14  CREATININE 0.85  CALCIUM 9.1    Liver Function Tests:  Recent Labs Lab 07/11/16 0056  AST 20  ALT 15*  ALKPHOS 71  BILITOT 0.8  PROT 7.1  ALBUMIN 4.1   No results for input(s): LIPASE, AMYLASE in the last 168 hours. No results for input(s): AMMONIA in the last 168 hours.  CBC:  Recent Labs Lab 07/11/16 0056  WBC 6.0  NEUTROABS 2.9  HGB 12.7*  HCT 38.3*  MCV 92.1  PLT 166    Cardiac Enzymes:  Recent Labs Lab 07/11/16 0056  TROPONINI <0.03    BNP:  Invalid input(s): POCBNP  CBG:  Recent Labs Lab 07/12/16 1140 07/12/16 1638 07/12/16 2031 07/13/16 0758 07/13/16 1155  GLUCAP 263* 108* 192* 199* 236*    Microbiology: No results found for this or any previous visit.  Coagulation Studies: No results for input(s): LABPROT, INR in the last 72 hours.  Urinalysis: No results for input(s): COLORURINE, LABSPEC, PHURINE, GLUCOSEU, HGBUR, BILIRUBINUR, KETONESUR, PROTEINUR, UROBILINOGEN, NITRITE, LEUKOCYTESUR in the last 168 hours.  Invalid input(s): APPERANCEUR  Lipid Panel:    Component Value Date/Time   CHOL 154 07/11/2016 0839   CHOL 138 08/02/2012 0436   TRIG 33 07/11/2016 0839   TRIG 56 08/02/2012 0436   HDL 60 07/11/2016 0839   HDL 61 (H) 08/02/2012 0436   CHOLHDL 2.6 07/11/2016 0839   VLDL 7 07/11/2016 0839   VLDL 11 08/02/2012 0436   LDLCALC 87 07/11/2016 0839   LDLCALC 66 08/02/2012 0436    HgbA1C:  Lab Results  Component Value Date   HGBA1C 7.7 (H) 07/11/2016    Urine Drug Screen:  No results  found for: LABOPIA, COCAINSCRNUR, LABBENZ, AMPHETMU, THCU, LABBARB  Alcohol Level: No results for input(s): ETH in the last 168 hours.  Other results: EKG:  normal sinus rhythm at 71 bpm.  Imaging: Mr Brain Wo Contrast  Result Date: 07/11/2016 CLINICAL DATA:  Vertebrobasilar insufficiency.  Dizziness. EXAM: MRI HEAD WITHOUT CONTRAST TECHNIQUE: Multiplanar, multiecho pulse sequences of the brain and surrounding structures were obtained without intravenous contrast. COMPARISON:  Head CT and CTA earlier today.  Brain MRI 11/28/2008. FINDINGS: Brain: There is no evidence of acute infarct, intracranial hemorrhage, mass, midline shift, or extra-axial fluid collection. There is mild cerebral atrophy. Periventricular white matter T2 hyperintensities are similar to the prior MRI and nonspecific but compatible with mild chronic small vessel ischemic disease. Vascular: Hypoplastic distal right vertebral artery, more fully evaluated on earlier CTA. Other major intracranial vascular flow voids are preserved. Skull and upper cervical spine: Unremarkable bone marrow signal. Sinuses/Orbits: Prior right cataract extraction. Postsurgical changes from prior sinus surgery. Other: None. IMPRESSION: 1. No acute intracranial abnormality. 2. Mild chronic small vessel ischemic disease. Electronically Signed   By: Logan Bores M.D.   On: 07/11/2016 15:09    Assessment: 76 y.o. male presenting after an episode of dizziness.  There was some concern for TIA.  MRI of the brain performed and shows no acute changes.  CTA shows significant vertebrobasilar disease, 123456 RICA and [id number] LICA stenosis.  A1c 7.7.  LDL 87.  Echocardiogram pending.  Patient on ASA and Plavix at home and is compliant.   Although significant VB disease patient on ASA and Plavix which is appropriate treatment at this time.  May continue follow up with Dr. Lucky Cowboy for carotid stenosis which at this time appears asymptomatic.    Stroke Risk Factors - diabetes  mellitus, hyperlipidemia and hypertension  Plan: 1. Risk factor management with continued efforts to control blood sugar and lipids 2.  Liberal BP control for perfusion.     Alexis Goodell, MD Neurology [phone] 07/13/2016, 2:52 PM

## 2016-07-13 NOTE — Discharge Summary (Signed)
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 Arthritis    knees, shoulder arms  . Carotid stenosis    a. 07/2012 U/S: < 50% bilat stenosis - Followed by Dr. Lucky Cowboy.  . Coronary artery disease    a. s/p CABG;  b. 07/2012 Myoview sm region of mildly dec perfusion in the basal to mid inf wall w/o signif ischemia.  EF 60%.  . DM II (diabetes mellitus, type II), controlled ([place])   . GERD (gastroesophageal reflux disease)   . History of prostate cancer   . Hyperlipidemia   . Hypertension   . Rectal cancer ([place]) 2001   Local resection.  . Scoliosis   . Sinus drainage   . TIA (transient ischemic attack)    several yrs ago - no deficits  . Weakness of both legs   . Wears dentures    full upper   HOSPITAL COURSE:  his is a 76 year old male admitted for vertebrobasilar insufficiency.  1. Dizziness: Secondary to vertebrobasilar insufficiency. Improved. CTA of the head actually cranial vessels confirmed severe carotid artery stenosis on the left as well as vertebrobasilar insufficiency.  No CVA per MRI of the brain and unremarkable echocardiogram. Follow-up Dr. Lucky Cowboy as Outpatient per Dr. Doy Mince.  2. Coronary artery disease: Stable; continue aspirin and Plavix.  3. Hypertension: Continue amlodipine, ACE inhibitor and long-acting metoprolol.  4. Diabetes mellitus type II: Hold oral hypoglycemic agents; continue basal insulin and Sliding scale insulin.  5. BPH: Continue Proscar  PT evaluation suggested  home health with PT.  I discussed with Dr. Doy Mince.  DISCHARGE CONDITIONS:  Stable, discharge to home with home health and PT today. CONSULTS OBTAINED:  Treatment Team:  Catarina Hartshorn, MD Alexis Goodell, MD DRUG ALLERGIES:   Allergies  Allergen Reactions  . Hctz [Hydrochlorothiazide] Other (See Comments)    Hyponatremia   . Hydralazine Other (See Comments)    hyponatremia  . Reserpine Other (See Comments)    hyponatremia  . Catapres [Clonidine Hcl] Rash   DISCHARGE MEDICATIONS:   Allergies as of 07/13/2016      Reactions   Hctz [hydrochlorothiazide] Other (See Comments)   Hyponatremia    Hydralazine Other (See Comments)   hyponatremia   Reserpine Other (See Comments)   hyponatremia   Catapres [clonidine Hcl] Rash      Medication List    TAKE these medications   amLODipine 10 MG tablet Commonly known as:  NORVASC Take 10 mg by mouth daily. AM   aspirin 81 MG tablet Take 81 mg by mouth daily. AM   benazepril 40 MG tablet Commonly known as:  LOTENSIN Take 40 mg by mouth 2 (two) times daily.   clopidogrel 75 MG tablet Commonly known as:  PLAVIX Take 75 mg by mouth daily. AM   finasteride 5 MG tablet Commonly known as:  PROSCAR Take 5 mg by mouth daily. AM   fluticasone 50 MCG/ACT nasal spray Commonly known as:  FLONASE Place into both nostrils 2 (two) times daily.   glipiZIDE 10 MG tablet Commonly  known as:  GLUCOTROL Take 10 mg by mouth 2 (two) times daily before a meal.   insulin glargine 100 UNIT/ML injection Commonly known as:  LANTUS Inject 8 Units into the skin at bedtime.   lovastatin 40 MG tablet Commonly known as:  MEVACOR Take 40 mg by mouth at bedtime.   metFORMIN 1000 MG tablet Commonly known as:  GLUCOPHAGE Takes 1000 mg am and 1500 mg pm daily.   metoprolol succinate 100 MG 24 hr tablet Commonly known as:  TOPROL-XL Take 100 mg by mouth daily. Take with or immediately following a meal.   multivitamin capsule Take 1  capsule by mouth daily.   pantoprazole 40 MG tablet Commonly known as:  PROTONIX Take 40 mg by mouth daily. AM   pioglitazone 45 MG tablet Commonly known as:  ACTOS Take 45 mg by mouth daily.   polyethylene glycol powder powder Commonly known as:  GLYCOLAX/MIRALAX 255 grams one bottle for colonoscopy prep   Vitamin B-12 5000 MCG Lozg Take 1 tablet by mouth daily. AM   VITAMIN B-12 IJ Inject as directed every 30 (thirty) days.   Vitamin D (Cholecalciferol) 1000 units Caps Take 1 capsule by mouth daily.        DISCHARGE INSTRUCTIONS:  See AVS.  If you experience worsening of your admission symptoms, develop shortness of breath, life threatening emergency, suicidal or homicidal thoughts you must seek medical attention immediately by calling 911 or calling your MD immediately  if symptoms less severe.  You Must read complete instructions/literature along with all the possible adverse reactions/side effects for all the Medicines you take and that have been prescribed to you. Take any new Medicines after you have completely understood and accpet all the possible adverse reactions/side effects.   Please note  You were cared for by a hospitalist during your hospital stay. If you have any questions about your discharge medications or the care you received while you were in the hospital after you are discharged, you can call the unit and asked to speak with the hospitalist on call if the hospitalist that took care of you is not available. Once you are discharged, your primary care physician will handle any further medical issues. Please note that NO REFILLS for any discharge medications will be authorized once you are discharged, as it is imperative that you return to your primary care physician (or establish a relationship with a primary care physician if you do not have one) for your aftercare needs so that they can reassess your need for medications and monitor your lab values.    On  the day of Discharge:  VITAL SIGNS:  Blood pressure (!) 152/74, pulse 66, temperature 97.6 F (36.4 C), resp. rate 20, height 6' 5.5" (1.969 m), weight 179 lb 4.8 oz (81.3 kg), SpO2 100 %. PHYSICAL EXAMINATION:  GENERAL:  76 y.o.-year-old patient lying in the bed with no acute distress.  EYES: Pupils equal, round, reactive to light and accommodation. No scleral icterus. Extraocular muscles intact.  HEENT: Head atraumatic, normocephalic. Oropharynx and nasopharynx clear.  NECK:  Supple, no jugular venous distention. No thyroid enlargement, no tenderness.  LUNGS: Normal breath sounds bilaterally, no wheezing, rales,rhonchi or crepitation. No use of accessory muscles of respiration.  CARDIOVASCULAR: S1, S2 normal. No murmurs, rubs, or gallops.  ABDOMEN: Soft, non-tender, non-distended. Bowel sounds present. No organomegaly or mass.  EXTREMITIES: No pedal edema, cyanosis, or clubbing.  NEUROLOGIC: Cranial nerves II through XII are intact. Muscle strength 5/5 in all extremities. Sensation  intact. Gait not checked.  PSYCHIATRIC: The patient is alert and oriented x 3.  SKIN: No obvious rash, lesion, or ulcer.  DATA REVIEW:   CBC  Recent Labs Lab 07/11/16 0056  WBC 6.0  HGB 12.7*  HCT 38.3*  PLT 166    Chemistries   Recent Labs Lab 07/11/16 0056  NA 135  K 3.5  CL 103  CO2 23  GLUCOSE 183*  BUN 14  CREATININE 0.85  CALCIUM 9.1  AST 20  ALT 15*  ALKPHOS 71  BILITOT 0.8     Microbiology Results  No results found for this or any previous visit.  RADIOLOGY:  No results found.   Management plans discussed with the patient, family and they are in agreement.  CODE STATUS:  Code Status History    Date Active Date Inactive Code Status Order ID Comments User Context   07/11/2016  7:48 AM 07/13/2016  3:55 PM Full Code [id number]  Harrie Foreman, MD Inpatient      TOTAL TIME TAKING CARE OF THIS PATIENT: 27 minutes.    Demetrios Loll M.D on 07/13/2016 at 4:18 PM  Between  7am to 6pm - Pager - [phone]  After 6pm go to [email or web address] - Proofreader  [facility]  Office  [phone]  CC: Primary care physician; Rusty Aus, MD   Note: This dictation was prepared with Dragon dictation along with smaller phrase technology. Any transcriptional errors that result from this process are unintentional.

## 2016-07-13 NOTE — Discharge Instructions (Signed)
Heart healthy and ADA diet. °Fall precaution. °HHPT °

## 2016-07-18 NOTE — Progress Notes (Signed)
[facility]  Patient Status: not taken under care for [facility] services, patient is not homebound. Notified Jeannie Fend, CM and Marshell Garfinkel, CM.   Jeremy Braun 07/18/2016, 9:11 AM

## 2016-07-19 ENCOUNTER — Ambulatory Visit (INDEPENDENT_AMBULATORY_CARE_PROVIDER_SITE_OTHER): Payer: Commercial Managed Care - HMO | Admitting: Vascular Surgery

## 2016-07-19 ENCOUNTER — Ambulatory Visit: Admit: 2016-07-19 | Payer: Medicare PPO | Admitting: Ophthalmology

## 2016-07-19 SURGERY — BLEPHAROPLASTY
Anesthesia: IV Sedation (MBSC Only) | Laterality: Bilateral

## 2016-07-22 DIAGNOSIS — I6523 Occlusion and stenosis of bilateral carotid arteries: Secondary | ICD-10-CM | POA: Diagnosis not present

## 2016-07-22 DIAGNOSIS — E782 Mixed hyperlipidemia: Secondary | ICD-10-CM | POA: Diagnosis not present

## 2016-07-22 DIAGNOSIS — I10 Essential (primary) hypertension: Secondary | ICD-10-CM | POA: Diagnosis not present

## 2016-07-22 DIAGNOSIS — G450 Vertebro-basilar artery syndrome: Secondary | ICD-10-CM | POA: Diagnosis not present

## 2016-07-22 DIAGNOSIS — E119 Type 2 diabetes mellitus without complications: Secondary | ICD-10-CM | POA: Diagnosis not present

## 2016-07-22 DIAGNOSIS — D509 Iron deficiency anemia, unspecified: Secondary | ICD-10-CM | POA: Diagnosis not present

## 2016-07-22 DIAGNOSIS — E538 Deficiency of other specified B group vitamins: Secondary | ICD-10-CM | POA: Diagnosis not present

## 2016-07-29 ENCOUNTER — Ambulatory Visit (INDEPENDENT_AMBULATORY_CARE_PROVIDER_SITE_OTHER): Payer: Medicare HMO | Admitting: Vascular Surgery

## 2016-07-29 ENCOUNTER — Encounter (INDEPENDENT_AMBULATORY_CARE_PROVIDER_SITE_OTHER): Payer: Self-pay | Admitting: Vascular Surgery

## 2016-07-29 VITALS — BP 170/85 | HR 66 | Resp 16 | Wt 180.0 lb

## 2016-07-29 DIAGNOSIS — E119 Type 2 diabetes mellitus without complications: Secondary | ICD-10-CM

## 2016-07-29 DIAGNOSIS — I10 Essential (primary) hypertension: Secondary | ICD-10-CM | POA: Diagnosis not present

## 2016-07-29 DIAGNOSIS — I6523 Occlusion and stenosis of bilateral carotid arteries: Secondary | ICD-10-CM

## 2016-07-29 DIAGNOSIS — E785 Hyperlipidemia, unspecified: Secondary | ICD-10-CM | POA: Diagnosis not present

## 2016-07-29 NOTE — Progress Notes (Signed)
Patient ID: Jeremy Braun, male   DOB: 1940/10/22, 76 y.o.   MRN: [id number]  Chief Complaint  Patient presents with  . Follow-up    HPI Jeremy Braun is a 76 y.o. male.  I am asked to see the patient by Dr. Bridgett Larsson at the [facility] for evaluation of carotid stenosis.  The patient reports An episode of what sounds like vertigo about 2-3 weeks ago. This prompted hospitalization. Multiple tests were done during this hospitalization including a CT angiogram of the neck which I have independently reviewed. This demonstrates progression of his right carotid artery stenosis now measuring in the 75-80% range officially and I would generally agree with this report. His left carotid artery stenosis remains in the 50% range. We have followed this for some time and he has had lesser degrees of stenosis in the 50% range by duplex previously. He did not appear to have an acute stroke. He did not have focal arm or leg weakness or numbness. He did not have speech or swallowing difficulty. He did not have temporary monocular blindness.   Past Medical History:  Diagnosis Date  . Arthritis    knees, shoulder arms  . Carotid stenosis    a. 07/2012 U/S: < 50% bilat stenosis - Followed by Dr. Lucky Cowboy.  . Coronary artery disease    a. s/p CABG;  b. 07/2012 Myoview sm region of mildly dec perfusion in the basal to mid inf wall w/o signif ischemia.  EF 60%.  . DM II (diabetes mellitus, type II), controlled ([place])   . GERD (gastroesophageal reflux disease)   . History of prostate cancer   . Hyperlipidemia   . Hypertension   . Rectal cancer ([place]) 2001   Local resection.  . Scoliosis   . Sinus drainage   . TIA (transient ischemic attack)    several yrs ago - no deficits  . Weakness of both legs   . Wears dentures    full upper    Past Surgical History:  Procedure Laterality Date  . CARDIAC CATHETERIZATION     before CABG  . COLONOSCOPY  09/29/2009  . CORONARY ARTERY BYPASS GRAFT  2014   [facility]  . EYE  SURGERY Right 2015   Inplant, Dr Lita Mains  . INGUINAL HERNIA REPAIR     multiple  . PROSTATE BIOPSY    . SHOULDER SURGERY Left     Family History  Problem Relation Age of Onset  . Heart disease Mother   . Heart disease Brother   No bleeding disorders, clotting disorders, or aneurysms  Social History Social History  Substance Use Topics  . Smoking status: Never Smoker  . Smokeless tobacco: Never Used  . Alcohol use No  No IV drug use  Allergies  Allergen Reactions  . Hctz [Hydrochlorothiazide] Other (See Comments)    Hyponatremia   . Hydralazine Other (See Comments)    hyponatremia  . Reserpine Other (See Comments)    hyponatremia  . Catapres [Clonidine Hcl] Rash    Current Outpatient Prescriptions  Medication Sig Dispense Refill  . amLODipine (NORVASC) 10 MG tablet Take 10 mg by mouth daily. AM    . aspirin 81 MG tablet Take 81 mg by mouth daily. AM    . benazepril (LOTENSIN) 40 MG tablet Take 40 mg by mouth 2 (two) times daily.    . clopidogrel (PLAVIX) 75 MG tablet Take 75 mg by mouth daily. AM    . Cyanocobalamin (VITAMIN B-12 IJ) Inject as directed  every 30 (thirty) days.    . Cyanocobalamin (VITAMIN B-12) 5000 MCG LOZG Take 1 tablet by mouth daily. AM    . finasteride (PROSCAR) 5 MG tablet Take 5 mg by mouth daily. AM    . fluticasone (FLONASE) 50 MCG/ACT nasal spray Place into both nostrils 2 (two) times daily.    Marland Kitchen glipiZIDE (GLUCOTROL) 10 MG tablet Take 10 mg by mouth 2 (two) times daily before a meal.    . insulin glargine (LANTUS) 100 UNIT/ML injection Inject 8 Units into the skin at bedtime.     . lovastatin (MEVACOR) 40 MG tablet Take 40 mg by mouth at bedtime.    . metFORMIN (GLUCOPHAGE) 1000 MG tablet Takes 1000 mg am and 1500 mg pm daily.    . metoprolol succinate (TOPROL-XL) 100 MG 24 hr tablet Take 100 mg by mouth daily. Take with or immediately following a meal.    . Multiple Vitamin (MULTIVITAMIN) capsule Take 1 capsule by mouth daily.    .  pantoprazole (PROTONIX) 40 MG tablet Take 40 mg by mouth daily. AM    . pioglitazone (ACTOS) 45 MG tablet Take 45 mg by mouth daily.    . polyethylene glycol powder (GLYCOLAX/MIRALAX) powder 255 grams one bottle for colonoscopy prep 255 g 0  . Vitamin D, Cholecalciferol, 1000 UNITS CAPS Take 1 capsule by mouth daily.     No current facility-administered medications for this visit.       REVIEW OF SYSTEMS (Negative unless checked)  Constitutional: _0 Weight loss  _1 Fever  _2 Chills Cardiac: _3 Chest pain   _4 Chest pressure   _5 Palpitations   _6 Shortness of breath when laying flat   _7 Shortness of breath at rest   _8 Shortness of breath with exertion. Vascular:  _9 Pain in legs with walking   _10 Pain in legs at rest   _11 Pain in legs when laying flat   _12 Claudication   _13 Pain in feet when walking  _14 Pain in feet at rest  _15 Pain in feet when laying flat   _16 History of DVT   _17 Phlebitis   _18 Swelling in legs   _19 Varicose veins   _20 Non-healing ulcers Pulmonary:   _21 Uses home oxygen   _22 Productive cough   _23 Hemoptysis   _24 Wheeze  _25 COPD   _26 Asthma Neurologic:  _27 Dizziness  _28 Blackouts   _29 Seizures   _30 History of stroke   _31 History of TIA  _32 Aphasia   _33 Temporary blindness   _34 Dysphagia   _35 Weakness or numbness in arms   _36 Weakness or numbness in legs Musculoskeletal:  _37 Arthritis   _38 Joint swelling   _39 Joint pain   _40 Low back pain Hematologic:  _41 Easy bruising  _42 Easy bleeding   _43 Hypercoagulable state   _44 Anemic  _45 Hepatitis Gastrointestinal:  _46 Blood in stool   _47 Vomiting blood  _48 Gastroesophageal reflux/heartburn   _49 Abdominal pain Genitourinary:  _50 Chronic kidney disease   _51 Difficult urination  _52 Frequent urination  _53 Burning with urination   _54 Hematuria Skin:  _55 Rashes   _56 Ulcers   _57 Wounds Psychological:  _58 History of anxiety   _59  History of major depression.    Physical Exam BP (!) 170/85   Pulse 66   Resp 16   Wt 180 lb (81.6 kg)   BMI 21.07 kg/m  Gen:  WD/WN, NAD. Appears younger than  stated age. Head: [place]/AT, No temporalis wasting. Prominent temp pulse not noted. Ear/Nose/Throat: Hearing grossly intact, nares w/o erythema or drainage, oropharynx w/o Erythema/Exudate Eyes: Conjunctiva clear, sclera non-icteric  Neck: trachea midline.  No JVD.  Pulmonary:  Good air movement, respirations not labored.  Cardiac: RRR, normal S1, S2.  Vascular:  Vessel Right Left  Radial Palpable Palpable                                   Gastrointestinal: soft, non-tender/non-distended. No guarding/reflex. No masses, surgical incisions, or scars. Musculoskeletal: M/S 5/5 throughout.  Extremities without ischemic changes.  No deformity or atrophy.  Neurologic: Sensation grossly intact in extremities.  Symmetrical.  Speech is fluent. Motor exam as listed above. Psychiatric: Judgment intact, Mood & affect appropriate for pt's clinical situation. Dermatologic: No rashes or ulcers noted.  No cellulitis or open wounds. Lymph : No Cervical, Axillary, or Inguinal lymphadenopathy.   Radiology Ct Angio Head W Or Wo Contrast  Result Date: 07/11/2016 CLINICAL DATA:  Initial evaluation for acute dizziness, blurry vision. EXAM: CT ANGIOGRAPHY HEAD AND NECK TECHNIQUE: Multidetector CT imaging of the head and neck was performed using the standard protocol during bolus administration of intravenous contrast. Multiplanar CT image reconstructions and MIPs were obtained to evaluate the vascular anatomy. Carotid stenosis measurements (when applicable) are obtained utilizing NASCET criteria, using the distal internal carotid diameter as the denominator. CONTRAST:  75 cc of Isovue 370. COMPARISON:  Prior head CT from earlier same day. FINDINGS: CTA NECK FINDINGS Aortic arch: Visualized aortic arch of normal caliber with normal 3 vessel morphology. Scattered plaque within the arch and about the origin of the great vessels without flow-limiting stenosis. Visualized subclavian arteries are widely patent. Right  carotid system: Right common carotid artery patent from its origin to the bifurcation. Extensive calcified plaque about the right bifurcation/ proximal right ICA. Associated stenosis of up to 75-80% by NASCET criteria. Distally, right ICA widely patent to the skullbase without stenosis, dissection, or occlusion. Left carotid system: Left common carotid artery patent from its origin to the bifurcation without stenosis. Extensive atheromatous plaque about the proximal left ICA. Associated stenosis of approximately 50% by NASCET criteria. Distally, left ICA widely patent to the skullbase without stenosis, dissection, or occlusion. Vertebral arteries: Both of the vertebral arteries arise from the subclavian arteries. Left vertebral artery dominant. Atheromatous plaque at the origin of the vertebral arteries bilaterally with associated moderate stenosis on the left and moderate to severe stenosis on the right. Vertebral arteries otherwise mildly tortuous but widely patent within the neck without stenosis, dissection, or occlusion. Skeleton: No acute osseous abnormality. No worrisome lytic or blastic osseous lesions. Other neck: Visualized soft tissues of the neck demonstrate no acute abnormality. No adenopathy. Thyroid normal. Upper chest: Visualized upper chest unremarkable. Review of the MIP images confirms the above findings CTA HEAD FINDINGS Anterior circulation: Scattered atheromatous plaque throughout the petrous, cavernous, and supraclinoid segments bilaterally with moderate diffuse narrowing. A1 segments patent. Anterior communicating artery normal. Anterior cerebral arteries demonstrate mild atheromatous irregularity but are patent to their distal aspects. M1 segments patent without stenosis or occlusion. MCA bifurcations normal. Short-segment proximal left M2 stenosis noted at the base of the left sylvian fissure (series 8, image 106). MCA branches fairly symmetric and well opacified bilaterally, although do  demonstrate multifocal atheromatous irregularity. Posterior circulation: Left vertebral artery dominant. Atheromatous plaque with multifocal moderate to severe left V4 stenoses. Right vertebral artery diminutive and largely terminates in PICA, although a tiny ascending branch ascends towards the vertebrobasilar junction. Posterior inferior cerebellar arteries themselves are patent. Basilar artery diminutive with multifocal atheromatous irregularity and moderate diffuse narrowing. Superior cerebellar arteries are patent bilaterally. P1 segments are hypoplastic bilaterally. Prominent bilateral posterior communicating artery  is which are largely patent. PCAs are supplied to their distal aspects, although demonstrate multifocal atheromatous irregularity. Venous sinuses: Patent. Anatomic variants: Robust posterior communicating arteries bilaterally with overall diminutive vertebrobasilar system. No aneurysm or vascular malformation. Delayed phase: No pathologic enhancement. Review of the MIP images confirms the above findings IMPRESSION: CTA NECK IMPRESSION: 1. Extensive atheromatous plaque about the carotid bifurcations bilaterally, with associated narrowing of up to 75-80% on the right, and 50% on the left. 2. Atheromatous plaque at the origin of the vertebral arteries bilaterally, with associated moderate stenosis on the left and moderate to severe stenosis on the right. Left vertebral artery is dominant. 3. Tortuosity of the major arterial vasculature of the neck, suggesting chronic underlying hypertension. CTA HEAD IMPRESSION: 1. Negative CTA for emergent large vessel occlusion. 2. Multifocal atheromatous plaque within the dominant left V4 segment with associated moderate to severe stenoses. Diminutive right vertebral artery largely terminates in PICA. Moderate diffuse narrowing of the proximal basilar artery. Overall, the vertebrobasilar system is somewhat diminutive as the PCAs are largely supplied via robust  posterior communicating arteries. 3. Extensive atheromatous plaque throughout the intracranial ICAs with moderate diffuse narrowing. Electronically Signed   By: Jeannine Boga M.D.   On: 07/11/2016 03:30   Ct Head Wo Contrast  Result Date: 07/11/2016 CLINICAL DATA:  Dizziness and blurred vision. Similar episode 3 days prior. EXAM: CT HEAD WITHOUT CONTRAST TECHNIQUE: Contiguous axial images were obtained from the base of the skull through the vertex without intravenous contrast. COMPARISON:  Head CT 10/19/2012 FINDINGS: Brain: No evidence of acute infarction, hemorrhage, hydrocephalus, extra-axial collection or mass lesion/mass effect. Stable generalized atrophy and chronic small vessel ischemia. Vascular: Atherosclerosis of skullbase vasculature without hyperdense vessel or abnormal calcification. Skull: Burr holes versus craniotomy right frontal. No acute fracture. Sinuses/Orbits: No fluid levels. Mastoid air cells are clear. Visualized orbits are unremarkable. Other: None. IMPRESSION: 1.  No acute intracranial abnormality. 2. Stable atrophy and chronic small vessel ischemia from prior exam. Electronically Signed   By: Jeb Levering M.D.   On: 07/11/2016 00:59   Ct Angio Neck W And/or Wo Contrast  Result Date: 07/11/2016 CLINICAL DATA:  Initial evaluation for acute dizziness, blurry vision. EXAM: CT ANGIOGRAPHY HEAD AND NECK TECHNIQUE: Multidetector CT imaging of the head and neck was performed using the standard protocol during bolus administration of intravenous contrast. Multiplanar CT image reconstructions and MIPs were obtained to evaluate the vascular anatomy. Carotid stenosis measurements (when applicable) are obtained utilizing NASCET criteria, using the distal internal carotid diameter as the denominator. CONTRAST:  75 cc of Isovue 370. COMPARISON:  Prior head CT from earlier same day. FINDINGS: CTA NECK FINDINGS Aortic arch: Visualized aortic arch of normal caliber with normal 3 vessel  morphology. Scattered plaque within the arch and about the origin of the great vessels without flow-limiting stenosis. Visualized subclavian arteries are widely patent. Right carotid system: Right common carotid artery patent from its origin to the bifurcation. Extensive calcified plaque about the right bifurcation/ proximal right ICA. Associated stenosis of up to 75-80% by NASCET criteria. Distally, right ICA widely patent to the skullbase without stenosis, dissection, or occlusion. Left carotid system: Left common carotid artery patent from its origin to the bifurcation without stenosis. Extensive atheromatous plaque about the proximal left ICA. Associated stenosis of approximately 50% by NASCET criteria. Distally, left ICA widely patent to the skullbase without stenosis, dissection, or occlusion. Vertebral arteries: Both of the vertebral arteries arise from the subclavian arteries. Left vertebral artery dominant. Atheromatous plaque  at the origin of the vertebral arteries bilaterally with associated moderate stenosis on the left and moderate to severe stenosis on the right. Vertebral arteries otherwise mildly tortuous but widely patent within the neck without stenosis, dissection, or occlusion. Skeleton: No acute osseous abnormality. No worrisome lytic or blastic osseous lesions. Other neck: Visualized soft tissues of the neck demonstrate no acute abnormality. No adenopathy. Thyroid normal. Upper chest: Visualized upper chest unremarkable. Review of the MIP images confirms the above findings CTA HEAD FINDINGS Anterior circulation: Scattered atheromatous plaque throughout the petrous, cavernous, and supraclinoid segments bilaterally with moderate diffuse narrowing. A1 segments patent. Anterior communicating artery normal. Anterior cerebral arteries demonstrate mild atheromatous irregularity but are patent to their distal aspects. M1 segments patent without stenosis or occlusion. MCA bifurcations normal.  Short-segment proximal left M2 stenosis noted at the base of the left sylvian fissure (series 8, image 106). MCA branches fairly symmetric and well opacified bilaterally, although do demonstrate multifocal atheromatous irregularity. Posterior circulation: Left vertebral artery dominant. Atheromatous plaque with multifocal moderate to severe left V4 stenoses. Right vertebral artery diminutive and largely terminates in PICA, although a tiny ascending branch ascends towards the vertebrobasilar junction. Posterior inferior cerebellar arteries themselves are patent. Basilar artery diminutive with multifocal atheromatous irregularity and moderate diffuse narrowing. Superior cerebellar arteries are patent bilaterally. P1 segments are hypoplastic bilaterally. Prominent bilateral posterior communicating artery is which are largely patent. PCAs are supplied to their distal aspects, although demonstrate multifocal atheromatous irregularity. Venous sinuses: Patent. Anatomic variants: Robust posterior communicating arteries bilaterally with overall diminutive vertebrobasilar system. No aneurysm or vascular malformation. Delayed phase: No pathologic enhancement. Review of the MIP images confirms the above findings IMPRESSION: CTA NECK IMPRESSION: 1. Extensive atheromatous plaque about the carotid bifurcations bilaterally, with associated narrowing of up to 75-80% on the right, and 50% on the left. 2. Atheromatous plaque at the origin of the vertebral arteries bilaterally, with associated moderate stenosis on the left and moderate to severe stenosis on the right. Left vertebral artery is dominant. 3. Tortuosity of the major arterial vasculature of the neck, suggesting chronic underlying hypertension. CTA HEAD IMPRESSION: 1. Negative CTA for emergent large vessel occlusion. 2. Multifocal atheromatous plaque within the dominant left V4 segment with associated moderate to severe stenoses. Diminutive right vertebral artery largely  terminates in PICA. Moderate diffuse narrowing of the proximal basilar artery. Overall, the vertebrobasilar system is somewhat diminutive as the PCAs are largely supplied via robust posterior communicating arteries. 3. Extensive atheromatous plaque throughout the intracranial ICAs with moderate diffuse narrowing. Electronically Signed   By: Jeannine Boga M.D.   On: 07/11/2016 03:30   Mr Brain Wo Contrast  Result Date: 07/11/2016 CLINICAL DATA:  Vertebrobasilar insufficiency.  Dizziness. EXAM: MRI HEAD WITHOUT CONTRAST TECHNIQUE: Multiplanar, multiecho pulse sequences of the brain and surrounding structures were obtained without intravenous contrast. COMPARISON:  Head CT and CTA earlier today.  Brain MRI 11/28/2008. FINDINGS: Brain: There is no evidence of acute infarct, intracranial hemorrhage, mass, midline shift, or extra-axial fluid collection. There is mild cerebral atrophy. Periventricular white matter T2 hyperintensities are similar to the prior MRI and nonspecific but compatible with mild chronic small vessel ischemic disease. Vascular: Hypoplastic distal right vertebral artery, more fully evaluated on earlier CTA. Other major intracranial vascular flow voids are preserved. Skull and upper cervical spine: Unremarkable bone marrow signal. Sinuses/Orbits: Prior right cataract extraction. Postsurgical changes from prior sinus surgery. Other: None. IMPRESSION: 1. No acute intracranial abnormality. 2. Mild chronic small vessel ischemic disease. Electronically Signed  By: Logan Bores M.D.   On: 07/11/2016 15:09    Labs Recent Results (from the past 2160 hour(s))  Troponin I     Status: None   Collection Time: 07/11/16 12:56 AM  Result Value Ref Range   Troponin I <0.03 <0.03 ng/mL  CBC with Differential     Status: Abnormal   Collection Time: 07/11/16 12:56 AM  Result Value Ref Range   WBC 6.0 3.8 - 10.6 K/uL   RBC 4.16 (L) 4.40 - 5.90 MIL/uL   Hemoglobin 12.7 (L) 13.0 - 18.0 g/dL   HCT  38.3 (L) 40.0 - 52.0 %   MCV 92.1 80.0 - 100.0 fL   MCH 30.5 26.0 - 34.0 pg   MCHC 33.2 32.0 - 36.0 g/dL   RDW 13.6 11.5 - 14.5 %   Platelets 166 150 - 440 K/uL   Neutrophils Relative % 49 %   Neutro Abs 2.9 1.4 - 6.5 K/uL   Lymphocytes Relative 41 %   Lymphs Abs 2.4 1.0 - 3.6 K/uL   Monocytes Relative 9 %   Monocytes Absolute 0.5 0.2 - 1.0 K/uL   Eosinophils Relative 1 %   Eosinophils Absolute 0.1 0 - 0.7 K/uL   Basophils Relative 0 %   Basophils Absolute 0.0 0 - 0.1 K/uL  Comprehensive metabolic panel     Status: Abnormal   Collection Time: 07/11/16 12:56 AM  Result Value Ref Range   Sodium 135 135 - 145 mmol/L   Potassium 3.5 3.5 - 5.1 mmol/L   Chloride 103 101 - 111 mmol/L   CO2 23 22 - 32 mmol/L   Glucose, Bld 183 (H) 65 - 99 mg/dL   BUN 14 6 - 20 mg/dL   Creatinine, Ser 0.85 0.61 - 1.24 mg/dL   Calcium 9.1 8.9 - 10.3 mg/dL   Total Protein 7.1 6.5 - 8.1 g/dL   Albumin 4.1 3.5 - 5.0 g/dL   AST 20 15 - 41 U/L   ALT 15 (L) 17 - 63 U/L   Alkaline Phosphatase 71 38 - 126 U/L   Total Bilirubin 0.8 0.3 - 1.2 mg/dL   GFR calc non Af Amer >60 >60 mL/min   GFR calc Af Amer >60 >60 mL/min    Comment: (NOTE) The eGFR has been calculated using the CKD EPI equation. This calculation has not been validated in all clinical situations. eGFR's persistently <60 mL/min signify possible Chronic Kidney Disease.    Anion gap 9 5 - 15  Glucose, capillary     Status: Abnormal   Collection Time: 07/11/16 12:59 AM  Result Value Ref Range   Glucose-Capillary 171 (H) 65 - 99 mg/dL  Glucose, capillary     Status: Abnormal   Collection Time: 07/11/16  7:56 AM  Result Value Ref Range   Glucose-Capillary 205 (H) 65 - 99 mg/dL  Hemoglobin A1c     Status: Abnormal   Collection Time: 07/11/16  8:39 AM  Result Value Ref Range   Hgb A1c MFr Bld 7.7 (H) 4.8 - 5.6 %    Comment: (NOTE)         Pre-diabetes: 5.7 - 6.4         Diabetes: >6.4         Glycemic control for adults with diabetes:  <7.0    Mean Plasma Glucose 174 mg/dL    Comment: (NOTE) Performed At: [facility] [address] [place], [place] [id number] Lindon Romp MD [id number]   Lipid panel  Status: None   Collection Time: 07/11/16  8:39 AM  Result Value Ref Range   Cholesterol 154 0 - 200 mg/dL   Triglycerides 33 <150 mg/dL   HDL 60 >40 mg/dL   Total CHOL/HDL Ratio 2.6 RATIO   VLDL 7 0 - 40 mg/dL   LDL Cholesterol 87 0 - 99 mg/dL    Comment:        Total Cholesterol/HDL:CHD Risk Coronary Heart Disease Risk Table                     Men   Women  1/2 Average Risk   3.4   3.3  Average Risk       5.0   4.4  2 X Average Risk   9.6   7.1  3 X Average Risk  23.4   11.0        Use the calculated Patient Ratio above and the CHD Risk Table to determine the patient's CHD Risk.        ATP III CLASSIFICATION (LDL):  <100     mg/dL   Optimal  100-129  mg/dL   Near or Above                    Optimal  130-159  mg/dL   Borderline  160-189  mg/dL   High  >190     mg/dL   Very High   TSH     Status: None   Collection Time: 07/11/16  8:39 AM  Result Value Ref Range   TSH 1.655 0.350 - 4.500 uIU/mL    Comment: Performed by a 3rd Generation assay with a functional sensitivity of <=0.01 uIU/mL.  Glucose, capillary     Status: Abnormal   Collection Time: 07/11/16 11:54 AM  Result Value Ref Range   Glucose-Capillary 221 (H) 65 - 99 mg/dL  Glucose, capillary     Status: Abnormal   Collection Time: 07/11/16  4:46 PM  Result Value Ref Range   Glucose-Capillary 102 (H) 65 - 99 mg/dL  Echocardiogram     Status: Abnormal   Collection Time: 07/11/16  8:19 PM  Result Value Ref Range   Weight 2,819.2 oz   Height 77.5 in   BP 138/68 mmHg   LV PW d 10.1 (A) 0.6 - 1.1 mm   FS 45 (A) 28 - 44 %   LA vol 59.9 mL   LA ID, A-P, ES 49 mm   IVS/LV PW RATIO, ED 1.05    LV e' LATERAL 11.3 cm/s   LV E/e' medial 6.14    LV E/e'average 6.14    LA diam index 2.36 cm/m2   LA vol A4C 60.1 ml   P 1/2  time 1198 ms   E decel time 299 msec   E/e' ratio 6.14    MV pk E vel 69.4 m/s   MV pk A vel 116 m/s   LA vol index 28.8 mL/m2   MV Dec 299    LA diam end sys 49.00 mm   TDI e' medial 3.59    TDI e' lateral 11.30    Lateral S' vel 8.92 cm/sec  Glucose, capillary     Status: Abnormal   Collection Time: 07/11/16  9:33 PM  Result Value Ref Range   Glucose-Capillary 201 (H) 65 - 99 mg/dL  Glucose, capillary     Status: Abnormal   Collection Time: 07/12/16  7:35 AM  Result Value Ref Range   Glucose-Capillary  192 (H) 65 - 99 mg/dL  Glucose, capillary     Status: Abnormal   Collection Time: 07/12/16 11:40 AM  Result Value Ref Range   Glucose-Capillary 263 (H) 65 - 99 mg/dL  Glucose, capillary     Status: Abnormal   Collection Time: 07/12/16  4:38 PM  Result Value Ref Range   Glucose-Capillary 108 (H) 65 - 99 mg/dL  Glucose, capillary     Status: Abnormal   Collection Time: 07/12/16  8:31 PM  Result Value Ref Range   Glucose-Capillary 192 (H) 65 - 99 mg/dL  Glucose, capillary     Status: Abnormal   Collection Time: 07/13/16  7:58 AM  Result Value Ref Range   Glucose-Capillary 199 (H) 65 - 99 mg/dL  Glucose, capillary     Status: Abnormal   Collection Time: 07/13/16 11:55 AM  Result Value Ref Range   Glucose-Capillary 236 (H) 65 - 99 mg/dL    Assessment/Plan:  Hyperlipidemia lipid control important in reducing the progression of atherosclerotic disease. Continue statin therapy   DM II (diabetes mellitus, type II), controlled blood glucose control important in reducing the progression of atherosclerotic disease. Also, involved in wound healing. On appropriate medications.   Hypertension blood pressure control important in reducing the progression of atherosclerotic disease. On appropriate oral medications.   Carotid stenosis Multiple tests were done during this hospitalization including a CT angiogram of the neck which I have independently reviewed. This demonstrates  progression of his right carotid artery stenosis now measuring in the 75-80% range officially and I would generally agree with this report. His left carotid artery stenosis remains in the 50% range. We have followed this for some time and he has had lesser degrees of stenosis in the 50% range by duplex previously. We had a long discussion today with the patient and his family. Although I do not think his recent vertigo symptoms were from his carotid, he is now in the range where it is certainly reasonable to consider surgery for stroke risk reduction. He has a borderline lesion and it would also be reasonable to continue aspirin, Plavix, and statin therapy. Either way would be acceptable. He could not decide today which way he would like to proceed, and we will start by making a 3 month follow-up visit with a carotid duplex. We have tried to discuss all the risks and benefits that were present with the procedure, and I think they have a pretty good understanding of the situation. He will call us if he decides to have right carotid endarterectomy but otherwise I will plan to see him back in 3 months. If he progresses beyond the 80% range, he should clearly have intervention.      Leotis Pain 07/29/2016, 11:57 AM   This note was created with Dragon medical transcription system.  Any errors from dictation are unintentional.

## 2016-07-29 NOTE — Assessment & Plan Note (Signed)
blood pressure control important in reducing the progression of atherosclerotic disease. On appropriate oral medications.  

## 2016-07-29 NOTE — Assessment & Plan Note (Signed)
blood glucose control important in reducing the progression of atherosclerotic disease. Also, involved in wound healing. On appropriate medications.  

## 2016-07-29 NOTE — Patient Instructions (Signed)

## 2016-07-29 NOTE — Assessment & Plan Note (Signed)
Multiple tests were done during this hospitalization including a CT angiogram of the neck which I have independently reviewed. This demonstrates progression of his right carotid artery stenosis now measuring in the 75-80% range officially and I would generally agree with this report. His left carotid artery stenosis remains in the 50% range. We have followed this for some time and he has had lesser degrees of stenosis in the 50% range by duplex previously. We had a long discussion today with the patient and his family. Although I do not think his recent vertigo symptoms were from his carotid, he is now in the range where it is certainly reasonable to consider surgery for stroke risk reduction. He has a borderline lesion and it would also be reasonable to continue aspirin, Plavix, and statin therapy. Either way would be acceptable. He could not decide today which way he would like to proceed, and we will start by making a 3 month follow-up visit with a carotid duplex. We have tried to discuss all the risks and benefits that were present with the procedure, and I think they have a pretty good understanding of the situation. He will call us if he decides to have right carotid endarterectomy but otherwise I will plan to see him back in 3 months. If he progresses beyond the 80% range, he should clearly have intervention.

## 2016-07-29 NOTE — Assessment & Plan Note (Signed)
lipid control important in reducing the progression of atherosclerotic disease. Continue statin therapy  

## 2016-08-17 DIAGNOSIS — I10 Essential (primary) hypertension: Secondary | ICD-10-CM | POA: Diagnosis not present

## 2016-08-17 DIAGNOSIS — E119 Type 2 diabetes mellitus without complications: Secondary | ICD-10-CM | POA: Diagnosis not present

## 2016-08-17 DIAGNOSIS — N400 Enlarged prostate without lower urinary tract symptoms: Secondary | ICD-10-CM | POA: Diagnosis not present

## 2016-08-17 DIAGNOSIS — E7800 Pure hypercholesterolemia, unspecified: Secondary | ICD-10-CM | POA: Diagnosis not present

## 2016-08-17 DIAGNOSIS — Z125 Encounter for screening for malignant neoplasm of prostate: Secondary | ICD-10-CM | POA: Diagnosis not present

## 2016-08-24 DIAGNOSIS — E7800 Pure hypercholesterolemia, unspecified: Secondary | ICD-10-CM | POA: Diagnosis not present

## 2016-08-24 DIAGNOSIS — Z0000 Encounter for general adult medical examination without abnormal findings: Secondary | ICD-10-CM | POA: Diagnosis not present

## 2016-08-24 DIAGNOSIS — I10 Essential (primary) hypertension: Secondary | ICD-10-CM | POA: Diagnosis not present

## 2016-08-24 DIAGNOSIS — J0100 Acute maxillary sinusitis, unspecified: Secondary | ICD-10-CM | POA: Diagnosis not present

## 2016-08-24 DIAGNOSIS — R0982 Postnasal drip: Secondary | ICD-10-CM | POA: Diagnosis not present

## 2016-08-24 DIAGNOSIS — I6523 Occlusion and stenosis of bilateral carotid arteries: Secondary | ICD-10-CM | POA: Diagnosis not present

## 2016-08-24 DIAGNOSIS — I2510 Atherosclerotic heart disease of native coronary artery without angina pectoris: Secondary | ICD-10-CM | POA: Diagnosis not present

## 2016-08-24 DIAGNOSIS — E119 Type 2 diabetes mellitus without complications: Secondary | ICD-10-CM | POA: Diagnosis not present

## 2016-09-26 DIAGNOSIS — R490 Dysphonia: Secondary | ICD-10-CM | POA: Diagnosis not present

## 2016-09-26 DIAGNOSIS — J301 Allergic rhinitis due to pollen: Secondary | ICD-10-CM | POA: Diagnosis not present

## 2016-09-26 DIAGNOSIS — J0101 Acute recurrent maxillary sinusitis: Secondary | ICD-10-CM | POA: Diagnosis not present

## 2016-09-26 DIAGNOSIS — E538 Deficiency of other specified B group vitamins: Secondary | ICD-10-CM | POA: Diagnosis not present

## 2016-10-28 DIAGNOSIS — E538 Deficiency of other specified B group vitamins: Secondary | ICD-10-CM | POA: Diagnosis not present

## 2016-11-01 ENCOUNTER — Ambulatory Visit (INDEPENDENT_AMBULATORY_CARE_PROVIDER_SITE_OTHER): Payer: Medicare HMO

## 2016-11-01 ENCOUNTER — Ambulatory Visit (INDEPENDENT_AMBULATORY_CARE_PROVIDER_SITE_OTHER): Payer: Medicare HMO | Admitting: Vascular Surgery

## 2016-11-01 ENCOUNTER — Encounter (INDEPENDENT_AMBULATORY_CARE_PROVIDER_SITE_OTHER): Payer: Self-pay | Admitting: Vascular Surgery

## 2016-11-01 VITALS — BP 166/70 | HR 53 | Resp 15 | Ht 76.5 in | Wt 181.0 lb

## 2016-11-01 DIAGNOSIS — I6523 Occlusion and stenosis of bilateral carotid arteries: Secondary | ICD-10-CM

## 2016-11-01 DIAGNOSIS — I10 Essential (primary) hypertension: Secondary | ICD-10-CM | POA: Diagnosis not present

## 2016-11-01 DIAGNOSIS — E119 Type 2 diabetes mellitus without complications: Secondary | ICD-10-CM

## 2016-11-01 DIAGNOSIS — E785 Hyperlipidemia, unspecified: Secondary | ICD-10-CM

## 2016-11-01 LAB — VAS US CAROTID
LCCADSYS: -83 cm/s
LCCAPSYS: 84 cm/s
LEFT ECA DIAS: -7 cm/s
LICADDIAS: -13 cm/s
LICADSYS: -57 cm/s
Left CCA dist dias: -12 cm/s
Left CCA prox dias: 5 cm/s
Left ICA prox dias: -17 cm/s
Left ICA prox sys: -151 cm/s
RCCADSYS: -47 cm/s
RCCAPSYS: 110 cm/s
RIGHT CCA MID DIAS: -6 cm/s

## 2016-11-01 NOTE — Assessment & Plan Note (Signed)
Carotid duplex today suggests moderate carotid artery stenosis in the 60-79% range on the right and then the 40-59% range on the left. He has a previous history of TIA type symptoms, but nothing since his last visit. We again discussed continued medical therapy with dual antiplatelet therapy and statin agent. We will continue to follow this closely and I will plan to see him back in 6 months. At this point, surgery would have only mild benefit and we will continue medical management.

## 2016-11-01 NOTE — Progress Notes (Signed)
MRN : [id number]  Jeremy Braun is a 76 y.o. ([birth year]) male who presents with chief complaint of  Chief Complaint  Patient presents with  . Carotid    3 month follow up  .  History of Present Illness: Patient returns in follow-up of his carotid disease. He has done well and had no major issues since his last visit several months ago. He denies any recent focal neurologic symptoms. Specifically, the patient denies amaurosis fugax, speech or swallowing difficulties, or arm or leg weakness or numbness. Carotid duplex today suggests moderate carotid artery stenosis in the 60-79% range on the right and then the 40-59% range on the left. This is in range from his previous findings on CT angiogram.      Past Medical History:  Diagnosis Date  . Arthritis    knees, shoulder arms  . Carotid stenosis    a. 07/2012 U/S: < 50% bilat stenosis - Followed by Dr. Lucky Cowboy.  . Coronary artery disease    a. s/p CABG;  b. 07/2012 Myoview sm region of mildly dec perfusion in the basal to mid inf wall w/o signif ischemia.  EF 60%.  . DM II (diabetes mellitus, type II), controlled ([place])   . GERD (gastroesophageal reflux disease)   . History of prostate cancer   . Hyperlipidemia   . Hypertension   . Rectal cancer ([place]) 2001   Local resection.  . Scoliosis   . Sinus drainage   . TIA (transient ischemic attack)    several yrs ago - no deficits  . Weakness of both legs   . Wears dentures    full upper         Past Surgical History:  Procedure Laterality Date  . CARDIAC CATHETERIZATION     before CABG  . COLONOSCOPY  09/29/2009  . CORONARY ARTERY BYPASS GRAFT  2014   [facility]  . EYE SURGERY Right 2015   Inplant, Dr Lita Mains  . INGUINAL HERNIA REPAIR     multiple  . PROSTATE BIOPSY    . SHOULDER SURGERY Left          Family History  Problem Relation Age of Onset  . Heart disease Mother   . Heart disease Brother   No bleeding disorders, clotting  disorders, or aneurysms  Social History     Social History  Substance Use Topics  . Smoking status: Never Smoker  . Smokeless tobacco: Never Used  . Alcohol use No  No IV drug use       Allergies  Allergen Reactions  . Hctz [Hydrochlorothiazide] Other (See Comments)    Hyponatremia   . Hydralazine Other (See Comments)    hyponatremia  . Reserpine Other (See Comments)    hyponatremia  . Catapres [Clonidine Hcl] Rash          Current Outpatient Prescriptions  Medication Sig Dispense Refill  . amLODipine (NORVASC) 10 MG tablet Take 10 mg by mouth daily. AM    . aspirin 81 MG tablet Take 81 mg by mouth daily. AM    . benazepril (LOTENSIN) 40 MG tablet Take 40 mg by mouth 2 (two) times daily.    . clopidogrel (PLAVIX) 75 MG tablet Take 75 mg by mouth daily. AM    . Cyanocobalamin (VITAMIN B-12 IJ) Inject as directed every 30 (thirty) days.    . Cyanocobalamin (VITAMIN B-12) 5000 MCG LOZG Take 1 tablet by mouth daily. AM    . finasteride (PROSCAR) 5 MG tablet Take 5  mg by mouth daily. AM    . fluticasone (FLONASE) 50 MCG/ACT nasal spray Place into both nostrils 2 (two) times daily.    Marland Kitchen glipiZIDE (GLUCOTROL) 10 MG tablet Take 10 mg by mouth 2 (two) times daily before a meal.    . insulin glargine (LANTUS) 100 UNIT/ML injection Inject 8 Units into the skin at bedtime.     . lovastatin (MEVACOR) 40 MG tablet Take 40 mg by mouth at bedtime.    . metFORMIN (GLUCOPHAGE) 1000 MG tablet Takes 1000 mg am and 1500 mg pm daily.    . metoprolol succinate (TOPROL-XL) 100 MG 24 hr tablet Take 100 mg by mouth daily. Take with or immediately following a meal.    . Multiple Vitamin (MULTIVITAMIN) capsule Take 1 capsule by mouth daily.    . pantoprazole (PROTONIX) 40 MG tablet Take 40 mg by mouth daily. AM    . pioglitazone (ACTOS) 45 MG tablet Take 45 mg by mouth daily.    . polyethylene glycol powder (GLYCOLAX/MIRALAX) powder 255 grams one bottle for  colonoscopy prep 255 g 0  . Vitamin D, Cholecalciferol, 1000 UNITS CAPS Take 1 capsule by mouth daily.     No current facility-administered medications for this visit.       REVIEW OF SYSTEMS (Negative unless checked)  Constitutional: [] Weight loss  [] Fever  [] Chills Cardiac: [] Chest pain   [] Chest pressure   [] Palpitations   [] Shortness of breath when laying flat   [] Shortness of breath at rest   [] Shortness of breath with exertion. Vascular:  [] Pain in legs with walking   [] Pain in legs at rest   [] Pain in legs when laying flat   [] Claudication   [] Pain in feet when walking  [] Pain in feet at rest  [] Pain in feet when laying flat   [] History of DVT   [] Phlebitis   [] Swelling in legs   [] Varicose veins   [] Non-healing ulcers Pulmonary:   [] Uses home oxygen   [] Productive cough   [] Hemoptysis   [] Wheeze  [] COPD   [] Asthma Neurologic:  [x] Dizziness  [] Blackouts   [] Seizures   [] History of stroke   [x] History of TIA  [] Aphasia   [] Temporary blindness   [] Dysphagia   [] Weakness or numbness in arms   [] Weakness or numbness in legs Musculoskeletal:  [x] Arthritis   [] Joint swelling   [] Joint pain   [] Low back pain Hematologic:  [] Easy bruising  [] Easy bleeding   [] Hypercoagulable state   [] Anemic  [] Hepatitis Gastrointestinal:  [] Blood in stool   [] Vomiting blood  [] Gastroesophageal reflux/heartburn   [] Abdominal pain Genitourinary:  [] Chronic kidney disease   [] Difficult urination  [] Frequent urination  [] Burning with urination   [] Hematuria Skin:  [] Rashes   [] Ulcers   [] Wounds Psychological:  [] History of anxiety   []  History of major depression.    Physical Examination  Vitals:   11/01/16 1103 11/01/16 1104  BP: (!) 171/74 (!) 166/70  Pulse: (!) 53   Resp: 15   Weight: 181 lb (82.1 kg)   Height: 6' 4.5" (1.943 m)    Body mass index is 21.74 kg/m. Gen:  WD/WN, NAD Head: [place]/AT, No temporalis wasting. Ear/Nose/Throat: Hearing grossly intact, nares w/o erythema or drainage,  trachea midline Eyes: Conjunctiva clear. Sclera non-icteric Neck: Supple.  No JVD.  Pulmonary:  Good air movement, equal and clear to auscultation bilaterally.  Cardiac: RRR, normal S1, S2, no Murmurs, rubs or gallops. Vascular:  Vessel Right Left  Radial Palpable Palpable  Gastrointestinal: soft, non-tender/non-distended. No guarding/reflex.  Musculoskeletal: M/S 5/5 throughout.  No deformity or atrophy. No edema. Neurologic: CN 2-12 intact. Sensation grossly intact in extremities.  Symmetrical.  Speech is fluent. Motor exam as listed above. Psychiatric: Judgment intact, Mood & affect appropriate for pt's clinical situation. Dermatologic: No rashes or ulcers noted.  No cellulitis or open wounds. Lymph : No Cervical, Axillary, or Inguinal lymphadenopathy.     CBC Lab Results  Component Value Date   WBC 6.0 07/11/2016   HGB 12.7 (L) 07/11/2016   HCT 38.3 (L) 07/11/2016   MCV 92.1 07/11/2016   PLT 166 07/11/2016    BMET    Component Value Date/Time   NA 135 07/11/2016 0056   NA 134 (L) 10/19/2012 1810   K 3.5 07/11/2016 0056   K 4.4 10/19/2012 1810   CL 103 07/11/2016 0056   CL 101 10/19/2012 1810   CO2 23 07/11/2016 0056   CO2 27 10/19/2012 1810   GLUCOSE 183 (H) 07/11/2016 0056   GLUCOSE 214 (H) 10/19/2012 1810   BUN 14 07/11/2016 0056   BUN 14 10/19/2012 1810   CREATININE 0.85 07/11/2016 0056   CREATININE 0.96 10/19/2012 1810   CALCIUM 9.1 07/11/2016 0056   CALCIUM 9.1 10/19/2012 1810   GFRNONAA >60 07/11/2016 0056   GFRNONAA >60 10/19/2012 1810   GFRAA >60 07/11/2016 0056   GFRAA >60 10/19/2012 1810   CrCl cannot be calculated (Patient's most recent lab result is older than the maximum 21 days allowed.).  COAG No results found for: INR, PROTIME  Radiology No results found.    Assessment/Plan Hyperlipidemia lipid control important in reducing the progression of atherosclerotic disease. Continue statin  therapy   DM II (diabetes mellitus, type II), controlled blood glucose control important in reducing the progression of atherosclerotic disease. Also, involved in wound healing. On appropriate medications.   Hypertension blood pressure control important in reducing the progression of atherosclerotic disease. On appropriate oral medications. Carotid stenosis Carotid duplex today suggests moderate carotid artery stenosis in the 60-79% range on the right and then the 40-59% range on the left. He has a previous history of TIA type symptoms, but nothing since his last visit. We again discussed continued medical therapy with dual antiplatelet therapy and statin agent. We will continue to follow this closely and I will plan to see him back in 6 months. At this point, surgery would have only mild benefit and we will continue medical management.    Leotis Pain, MD  11/01/2016 1:20 PM    This note was created with Dragon medical transcription system.  Any errors from dictation are purely unintentional

## 2016-11-18 ENCOUNTER — Encounter (INDEPENDENT_AMBULATORY_CARE_PROVIDER_SITE_OTHER): Payer: Commercial Managed Care - HMO

## 2016-11-18 ENCOUNTER — Ambulatory Visit (INDEPENDENT_AMBULATORY_CARE_PROVIDER_SITE_OTHER): Payer: Commercial Managed Care - HMO | Admitting: Vascular Surgery

## 2016-12-05 DIAGNOSIS — E538 Deficiency of other specified B group vitamins: Secondary | ICD-10-CM | POA: Diagnosis not present

## 2016-12-20 DIAGNOSIS — E119 Type 2 diabetes mellitus without complications: Secondary | ICD-10-CM | POA: Diagnosis not present

## 2016-12-20 DIAGNOSIS — I10 Essential (primary) hypertension: Secondary | ICD-10-CM | POA: Diagnosis not present

## 2016-12-20 DIAGNOSIS — E7800 Pure hypercholesterolemia, unspecified: Secondary | ICD-10-CM | POA: Diagnosis not present

## 2016-12-29 DIAGNOSIS — I10 Essential (primary) hypertension: Secondary | ICD-10-CM | POA: Diagnosis not present

## 2016-12-29 DIAGNOSIS — E7800 Pure hypercholesterolemia, unspecified: Secondary | ICD-10-CM | POA: Diagnosis not present

## 2016-12-29 DIAGNOSIS — M5136 Other intervertebral disc degeneration, lumbar region: Secondary | ICD-10-CM | POA: Diagnosis not present

## 2016-12-29 DIAGNOSIS — E119 Type 2 diabetes mellitus without complications: Secondary | ICD-10-CM | POA: Diagnosis not present

## 2016-12-29 DIAGNOSIS — M5416 Radiculopathy, lumbar region: Secondary | ICD-10-CM | POA: Diagnosis not present

## 2016-12-29 DIAGNOSIS — G450 Vertebro-basilar artery syndrome: Secondary | ICD-10-CM | POA: Diagnosis not present

## 2016-12-29 DIAGNOSIS — I6529 Occlusion and stenosis of unspecified carotid artery: Secondary | ICD-10-CM | POA: Diagnosis not present

## 2016-12-29 DIAGNOSIS — I2510 Atherosclerotic heart disease of native coronary artery without angina pectoris: Secondary | ICD-10-CM | POA: Diagnosis not present

## 2017-01-02 DIAGNOSIS — J0100 Acute maxillary sinusitis, unspecified: Secondary | ICD-10-CM | POA: Diagnosis not present

## 2017-01-09 DIAGNOSIS — E538 Deficiency of other specified B group vitamins: Secondary | ICD-10-CM | POA: Diagnosis not present

## 2017-01-20 DIAGNOSIS — E113393 Type 2 diabetes mellitus with moderate nonproliferative diabetic retinopathy without macular edema, bilateral: Secondary | ICD-10-CM | POA: Diagnosis not present

## 2017-01-25 DIAGNOSIS — H26491 Other secondary cataract, right eye: Secondary | ICD-10-CM | POA: Diagnosis not present

## 2017-02-10 DIAGNOSIS — E538 Deficiency of other specified B group vitamins: Secondary | ICD-10-CM | POA: Diagnosis not present

## 2017-02-15 DIAGNOSIS — K219 Gastro-esophageal reflux disease without esophagitis: Secondary | ICD-10-CM | POA: Diagnosis not present

## 2017-02-15 DIAGNOSIS — J301 Allergic rhinitis due to pollen: Secondary | ICD-10-CM | POA: Diagnosis not present

## 2017-03-13 DIAGNOSIS — E538 Deficiency of other specified B group vitamins: Secondary | ICD-10-CM | POA: Diagnosis not present

## 2017-03-15 DIAGNOSIS — J301 Allergic rhinitis due to pollen: Secondary | ICD-10-CM | POA: Diagnosis not present

## 2017-03-27 DIAGNOSIS — J029 Acute pharyngitis, unspecified: Secondary | ICD-10-CM | POA: Diagnosis not present

## 2017-04-13 DIAGNOSIS — E119 Type 2 diabetes mellitus without complications: Secondary | ICD-10-CM | POA: Diagnosis not present

## 2017-04-13 DIAGNOSIS — E538 Deficiency of other specified B group vitamins: Secondary | ICD-10-CM | POA: Diagnosis not present

## 2017-04-13 DIAGNOSIS — I10 Essential (primary) hypertension: Secondary | ICD-10-CM | POA: Diagnosis not present

## 2017-04-13 DIAGNOSIS — I2510 Atherosclerotic heart disease of native coronary artery without angina pectoris: Secondary | ICD-10-CM | POA: Diagnosis not present

## 2017-04-13 DIAGNOSIS — E7800 Pure hypercholesterolemia, unspecified: Secondary | ICD-10-CM | POA: Diagnosis not present

## 2017-04-13 DIAGNOSIS — M5416 Radiculopathy, lumbar region: Secondary | ICD-10-CM | POA: Diagnosis not present

## 2017-04-13 DIAGNOSIS — G450 Vertebro-basilar artery syndrome: Secondary | ICD-10-CM | POA: Diagnosis not present

## 2017-05-09 ENCOUNTER — Ambulatory Visit (INDEPENDENT_AMBULATORY_CARE_PROVIDER_SITE_OTHER): Payer: Medicare HMO

## 2017-05-09 ENCOUNTER — Encounter (INDEPENDENT_AMBULATORY_CARE_PROVIDER_SITE_OTHER): Payer: Self-pay | Admitting: Vascular Surgery

## 2017-05-09 ENCOUNTER — Ambulatory Visit (INDEPENDENT_AMBULATORY_CARE_PROVIDER_SITE_OTHER): Payer: Medicare HMO | Admitting: Vascular Surgery

## 2017-05-09 ENCOUNTER — Encounter (INDEPENDENT_AMBULATORY_CARE_PROVIDER_SITE_OTHER): Payer: Self-pay

## 2017-05-09 VITALS — BP 184/77 | HR 54 | Resp 17 | Wt 187.0 lb

## 2017-05-09 DIAGNOSIS — I6523 Occlusion and stenosis of bilateral carotid arteries: Secondary | ICD-10-CM

## 2017-05-09 DIAGNOSIS — I10 Essential (primary) hypertension: Secondary | ICD-10-CM

## 2017-05-09 DIAGNOSIS — E785 Hyperlipidemia, unspecified: Secondary | ICD-10-CM | POA: Diagnosis not present

## 2017-05-09 DIAGNOSIS — E119 Type 2 diabetes mellitus without complications: Secondary | ICD-10-CM

## 2017-05-09 NOTE — Patient Instructions (Signed)
Carotid Artery Disease The carotid arteries are arteries on both sides of the neck. They carry blood to the brain. Carotid artery disease is when the arteries get smaller (narrow) or get blocked. If these arteries get smaller or get blocked, you are more likely to have a stroke or warning stroke (transient ischemic attack). Follow these instructions at home:  Take medicines as told by your doctor. Make sure you understand all your medicine instructions. Do not stop your medicines without talking to your doctor first.  Follow your doctor's diet instructions. It is important to eat a healthy diet that includes plenty of: ? Fresh fruits. ? Vegetables. ? Lean meats.  Avoid: ? High-fat foods. ? High-sodium foods. ? Foods that are fried, overly processed, or have poor nutritional value.  Stay a healthy weight.  Stay active. Get at least 30 minutes of activity every day.  Do not smoke.  Limit alcohol use to: ? No more than 2 drinks a day for men. ? No more than 1 drink a day for women who are not pregnant.  Do not use illegal drugs.  Keep all doctor visits as told. Get help right away if:  You have sudden weakness or loss of feeling (numbness) on one side of the body, such as the face, arm, or leg.  You have sudden confusion.  You have trouble speaking (aphasia) or understanding.  You have sudden trouble seeing out of one or both eyes.  You have sudden trouble walking.  You have dizziness or feel like you might pass out (faint).  You have a loss of balance or your movements are not steady (uncoordinated).  You have a sudden, severe headache with no known cause.  You have trouble swallowing (dysphagia). Call your local emergency services (911 in [place] Do notdrive yourself to the clinic or hospital. This information is not intended to replace advice given to you by your health care provider. Make sure you discuss any questions you have with your health care  provider. Document Released: 05/16/2012 Document Revised: 11/05/2015 Document Reviewed: 11/28/2012 Elsevier Interactive Patient Education  2018 [employer]  

## 2017-05-09 NOTE — Progress Notes (Signed)
MRN : [id number]  Jeremy Braun is a 76 y.o. (11/23/1940) male who presents with chief complaint of  Chief Complaint  Patient presents with  . Carotid    27mo follow up  .  History of Present Illness: Patient returns in follow-up of his carotid disease.  He is doing well.  He has a remote history of TIA but no symptoms since his last visit.  He has no complaints today. Carotid duplex today suggests moderate carotid artery stenosis in the 60-79% range on the right and then the 40-59% range on the left.      Past Medical History:  Diagnosis Date  . Arthritis    knees, shoulder arms  . Carotid stenosis    a. 07/2012 U/S: < 50% bilat stenosis - Followed by Dr. Lucky Cowboy.  . Coronary artery disease    a. s/p CABG; b. 07/2012 Myoview sm region of mildly dec perfusion in the basal to mid inf wall w/o signif ischemia. EF 60%.  . DM II (diabetes mellitus, type II), controlled ([place])   . GERD (gastroesophageal reflux disease)   . History of prostate cancer   . Hyperlipidemia   . Hypertension   . Rectal cancer ([place]) 2001   Local resection.  . Scoliosis   . Sinus drainage   . TIA (transient ischemic attack)    several yrs ago - no deficits  . Weakness of both legs   . Wears dentures    full upper         Past Surgical History:  Procedure Laterality Date  . CARDIAC CATHETERIZATION     before CABG  . COLONOSCOPY  09/29/2009  . CORONARY ARTERY BYPASS GRAFT  2014   [facility]  . EYE SURGERY Right 2015   Inplant, Dr Lita Mains  . INGUINAL HERNIA REPAIR     multiple  . PROSTATE BIOPSY    . SHOULDER SURGERY Left          Family History  Problem Relation Age of Onset  . Heart disease Mother   . Heart disease Brother   No bleeding disorders, clotting disorders, or aneurysms  Social History     Social History  Substance Use Topics  . Smoking status: Never Smoker  . Smokeless tobacco: Never Used  . Alcohol use No  No IV drug  use       Allergies  Allergen Reactions  . Hctz [Hydrochlorothiazide] Other (See Comments)    Hyponatremia   . Hydralazine Other (See Comments)    hyponatremia  . Reserpine Other (See Comments)    hyponatremia  . Catapres [Clonidine Hcl] Rash          Current Outpatient Prescriptions  Medication Sig Dispense Refill  . amLODipine (NORVASC) 10 MG tablet Take 10 mg by mouth daily. AM    . aspirin 81 MG tablet Take 81 mg by mouth daily. AM    . benazepril (LOTENSIN) 40 MG tablet Take 40 mg by mouth 2 (two) times daily.    . clopidogrel (PLAVIX) 75 MG tablet Take 75 mg by mouth daily. AM    . Cyanocobalamin (VITAMIN B-12 IJ) Inject as directed every 30 (thirty) days.    . Cyanocobalamin (VITAMIN B-12) 5000 MCG LOZG Take 1 tablet by mouth daily. AM    . finasteride (PROSCAR) 5 MG tablet Take 5 mg by mouth daily. AM    . fluticasone (FLONASE) 50 MCG/ACT nasal spray Place into both nostrils 2 (two) times daily.    Marland Kitchen glipiZIDE (  GLUCOTROL) 10 MG tablet Take 10 mg by mouth 2 (two) times daily before a meal.    . insulin glargine (LANTUS) 100 UNIT/ML injection Inject 8 Units into the skin at bedtime.     . lovastatin (MEVACOR) 40 MG tablet Take 40 mg by mouth at bedtime.    . metFORMIN (GLUCOPHAGE) 1000 MG tablet Takes 1000 mg am and 1500 mg pm daily.    . metoprolol succinate (TOPROL-XL) 100 MG 24 hr tablet Take 100 mg by mouth daily. Take with or immediately following a meal.    . Multiple Vitamin (MULTIVITAMIN) capsule Take 1 capsule by mouth daily.    . pantoprazole (PROTONIX) 40 MG tablet Take 40 mg by mouth daily. AM    . pioglitazone (ACTOS) 45 MG tablet Take 45 mg by mouth daily.    . polyethylene glycol powder (GLYCOLAX/MIRALAX) powder 255 grams one bottle for colonoscopy prep 255 g 0  . Vitamin D, Cholecalciferol, 1000 UNITS CAPS Take 1 capsule by mouth daily.     No current facility-administered medications for this visit.        REVIEW OF SYSTEMS(Negative unless checked)  Constitutional: [] Weight loss[] Fever[] Chills Cardiac:[] Chest pain[] Chest pressure[] Palpitations [] Shortness of breath when laying flat [] Shortness of breath at rest [] Shortness of breath with exertion. Vascular: [] Pain in legs with walking[] Pain in legsat rest[] Pain in legs when laying flat [] Claudication [] Pain in feet when walking [] Pain in feet at rest [] Pain in feet when laying flat [] History of DVT [] Phlebitis [] Swelling in legs [] Varicose veins [] Non-healing ulcers Pulmonary: [] Uses home oxygen [] Productive cough[] Hemoptysis [] Wheeze [] COPD [] Asthma Neurologic: [x] Dizziness [] Blackouts [] Seizures [] History of stroke [x] History of TIA[] Aphasia [] Temporary blindness[] Dysphagia [] Weaknessor numbness in arms [] Weakness or numbnessin legs Musculoskeletal: [x] Arthritis [] Joint swelling [] Joint pain [] Low back pain Hematologic:[] Easy bruising[] Easy bleeding [] Hypercoagulable state [] Anemic [] Hepatitis Gastrointestinal:[] Blood in stool[] Vomiting blood[] Gastroesophageal reflux/heartburn[] Abdominal pain Genitourinary: [] Chronic kidney disease [] Difficulturination [] Frequenturination [] Burning with urination[] Hematuria Skin: [] Rashes [] Ulcers [] Wounds Psychological: [] History of anxiety[] History of major depression.     Physical Examination  Vitals:   05/09/17 1518 05/09/17 1519  BP: (!) 178/80 (!) 184/77  Pulse: (!) 55 (!) 54  Resp: 17   Weight: 187 lb (84.8 kg)    Body mass index is 22.47 kg/m. Gen:  WD/WN, NAD Head: [place]/AT, No temporalis wasting. Ear/Nose/Throat: Hearing grossly intact, nares w/o erythema or drainage, trachea midline Eyes: Conjunctiva clear. Sclera non-icteric Neck: Supple.  Right carotid bruit present Pulmonary:  Good air movement, equal and clear to auscultation bilaterally.  Cardiac: RRR,  normal S1, S2 Vascular:  Vessel Right Left  Radial Palpable Palpable                                    Musculoskeletal: M/S 5/5 throughout.  No deformity or atrophy.  No edema. Neurologic: CN 2-12 intact. Sensation grossly intact in extremities.  Symmetrical.  Speech is fluent. Motor exam as listed above. Psychiatric: Judgment intact, Mood & affect appropriate for pt's clinical situation. Dermatologic: No rashes or ulcers noted.  No cellulitis or open wounds.      CBC Lab Results  Component Value Date   WBC 6.0 07/11/2016   HGB 12.7 (L) 07/11/2016   HCT 38.3 (L) 07/11/2016   MCV 92.1 07/11/2016   PLT 166 07/11/2016    BMET    Component Value Date/Time   NA 135 07/11/2016 0056   NA 134 (L) 10/19/2012 1810   K 3.5 07/11/2016 0056   K 4.4 10/19/2012 1810  CL 103 07/11/2016 0056   CL 101 10/19/2012 1810   CO2 23 07/11/2016 0056   CO2 27 10/19/2012 1810   GLUCOSE 183 (H) 07/11/2016 0056   GLUCOSE 214 (H) 10/19/2012 1810   BUN 14 07/11/2016 0056   BUN 14 10/19/2012 1810   CREATININE 0.85 07/11/2016 0056   CREATININE 0.96 10/19/2012 1810   CALCIUM 9.1 07/11/2016 0056   CALCIUM 9.1 10/19/2012 1810   GFRNONAA >60 07/11/2016 0056   GFRNONAA >60 10/19/2012 1810   GFRAA >60 07/11/2016 0056   GFRAA >60 10/19/2012 1810   CrCl cannot be calculated (Patient's most recent lab result is older than the maximum 21 days allowed.).  COAG No results found for: INR, PROTIME  Radiology No results found.    Assessment/Plan Hyperlipidemia lipid control important in reducing the progression of atherosclerotic disease. Continue statin therapy   DM II (diabetes mellitus, type II), controlled blood glucose control important in reducing the progression of atherosclerotic disease. Also, involved in wound healing. On appropriate medications.   Hypertension blood pressure control important in reducing the progression of atherosclerotic disease. On appropriate oral  medications. Carotid stenosis Carotid duplex today suggests moderate carotid artery stenosis in the 60-79% range on the right and then the 40-59% range on the left. He has a previous history of TIA type symptoms, but nothing since his last visit. We again discussed continued medical therapy with dual antiplatelet therapy and statin agent. We will continue to follow this closely and I will plan to see him back in 6 months. At this point, surgery would have only mild benefit and we will continue medical management.       Leotis Pain, MD  05/09/2017 5:44 PM    This note was created with Dragon medical transcription system.  Any errors from dictation are purely unintentional

## 2017-05-15 DIAGNOSIS — E538 Deficiency of other specified B group vitamins: Secondary | ICD-10-CM | POA: Diagnosis not present

## 2017-07-26 DIAGNOSIS — E538 Deficiency of other specified B group vitamins: Secondary | ICD-10-CM | POA: Diagnosis not present

## 2017-08-07 DIAGNOSIS — M25862 Other specified joint disorders, left knee: Secondary | ICD-10-CM | POA: Diagnosis not present

## 2017-08-07 DIAGNOSIS — W19XXXA Unspecified fall, initial encounter: Secondary | ICD-10-CM | POA: Diagnosis not present

## 2017-08-07 DIAGNOSIS — S8992XA Unspecified injury of left lower leg, initial encounter: Secondary | ICD-10-CM | POA: Diagnosis not present

## 2017-09-28 DIAGNOSIS — E559 Vitamin D deficiency, unspecified: Secondary | ICD-10-CM | POA: Diagnosis not present

## 2017-09-28 DIAGNOSIS — Z1389 Encounter for screening for other disorder: Secondary | ICD-10-CM | POA: Diagnosis not present

## 2017-09-28 DIAGNOSIS — H6120 Impacted cerumen, unspecified ear: Secondary | ICD-10-CM | POA: Diagnosis not present

## 2017-09-28 DIAGNOSIS — R55 Syncope and collapse: Secondary | ICD-10-CM | POA: Diagnosis not present

## 2017-09-28 DIAGNOSIS — I6521 Occlusion and stenosis of right carotid artery: Secondary | ICD-10-CM | POA: Diagnosis not present

## 2017-09-28 DIAGNOSIS — D519 Vitamin B12 deficiency anemia, unspecified: Secondary | ICD-10-CM | POA: Diagnosis not present

## 2017-09-28 DIAGNOSIS — Z79899 Other long term (current) drug therapy: Secondary | ICD-10-CM | POA: Diagnosis not present

## 2017-09-28 DIAGNOSIS — Z713 Dietary counseling and surveillance: Secondary | ICD-10-CM | POA: Diagnosis not present

## 2017-09-28 DIAGNOSIS — E782 Mixed hyperlipidemia: Secondary | ICD-10-CM | POA: Diagnosis not present

## 2017-09-28 DIAGNOSIS — E118 Type 2 diabetes mellitus with unspecified complications: Secondary | ICD-10-CM | POA: Diagnosis not present

## 2017-09-28 DIAGNOSIS — Z5181 Encounter for therapeutic drug level monitoring: Secondary | ICD-10-CM | POA: Diagnosis not present

## 2017-09-28 DIAGNOSIS — H1032 Unspecified acute conjunctivitis, left eye: Secondary | ICD-10-CM | POA: Diagnosis not present

## 2017-09-28 DIAGNOSIS — D511 Vitamin B12 deficiency anemia due to selective vitamin B12 malabsorption with proteinuria: Secondary | ICD-10-CM | POA: Diagnosis not present

## 2017-09-28 DIAGNOSIS — I10 Essential (primary) hypertension: Secondary | ICD-10-CM | POA: Diagnosis not present

## 2017-10-16 DIAGNOSIS — K219 Gastro-esophageal reflux disease without esophagitis: Secondary | ICD-10-CM | POA: Diagnosis not present

## 2017-10-16 DIAGNOSIS — I10 Essential (primary) hypertension: Secondary | ICD-10-CM | POA: Diagnosis not present

## 2017-10-16 DIAGNOSIS — E119 Type 2 diabetes mellitus without complications: Secondary | ICD-10-CM | POA: Diagnosis not present

## 2017-10-16 DIAGNOSIS — Z713 Dietary counseling and surveillance: Secondary | ICD-10-CM | POA: Diagnosis not present

## 2017-10-16 DIAGNOSIS — E782 Mixed hyperlipidemia: Secondary | ICD-10-CM | POA: Diagnosis not present

## 2017-10-16 DIAGNOSIS — Z6823 Body mass index (BMI) 23.0-23.9, adult: Secondary | ICD-10-CM | POA: Diagnosis not present

## 2017-10-16 DIAGNOSIS — Z1389 Encounter for screening for other disorder: Secondary | ICD-10-CM | POA: Diagnosis not present

## 2017-10-16 DIAGNOSIS — I6521 Occlusion and stenosis of right carotid artery: Secondary | ICD-10-CM | POA: Diagnosis not present

## 2017-12-29 IMAGING — MR MR HEAD W/O CM
10 series · 41 of 48 positions shown · non-contrast
Comparison: Head CT and CTA earlier today.  Brain MRI 11/28/2008.

CLINICAL DATA: Vertebrobasilar insufficiency.  Dizziness.

EXAM:
MRI HEAD WITHOUT CONTRAST
TECHNIQUE: Multiplanar, multiecho pulse sequences of the brain and surrounding
structures were obtained without intravenous contrast.

[Series 2: T1 · sagittal · 5.0mm · 0.45mm/px · 3 of 29 slices shown (1 of 2)]
[im 1/29]
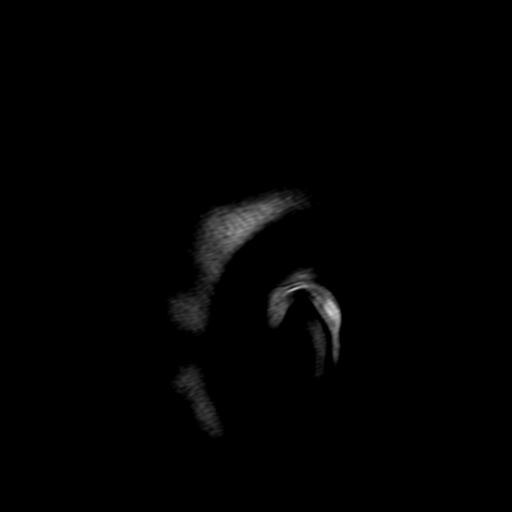
[im 15/29]
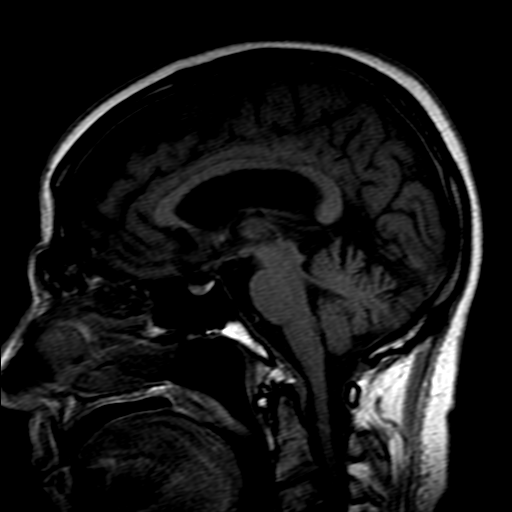
[im 29/29]
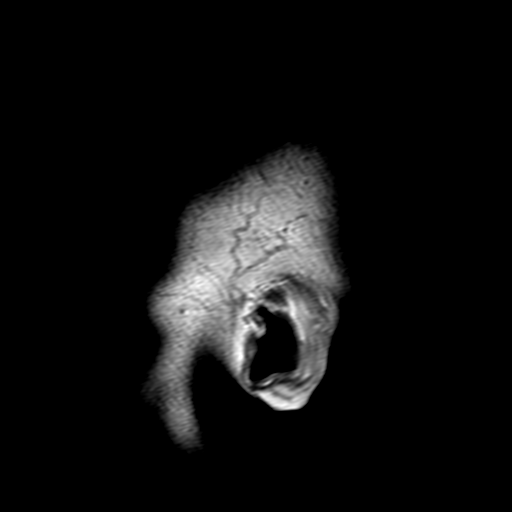

[Series 4: DWI · axial · 4.0mm · 0.94mm/px · z∈[-53,+122]mm · 5 of 45 slices shown (1 of 4)]
[im 1/45]
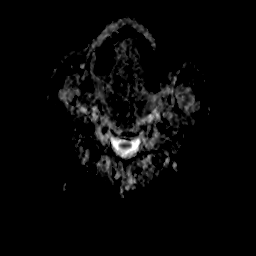
[im 12/45]
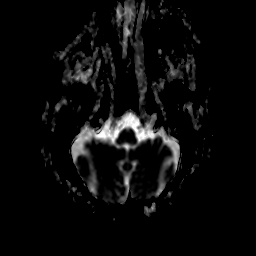
[im 23/45]
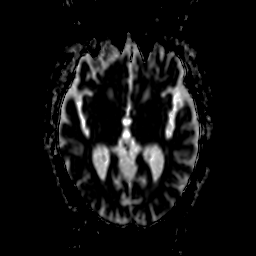
[im 34/45]
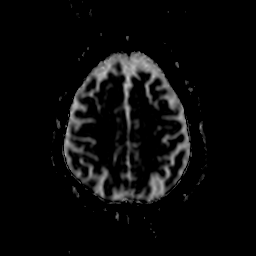
[im 45/45]
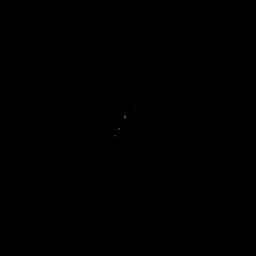

[Series 5: DWI · axial · 4.0mm · 0.94mm/px · z∈[-53,+122]mm · 6 of 45 slices shown (2 of 4)]
[im 1/45]
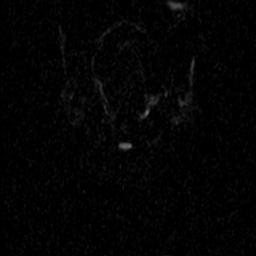
[im 9/45]
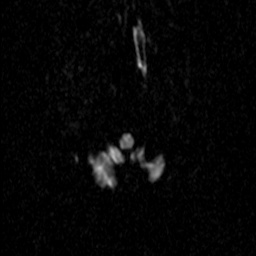
[im 18/45]
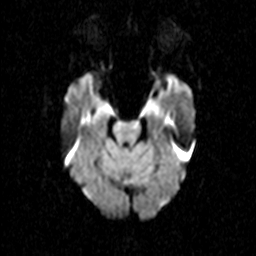
[im 27/45]
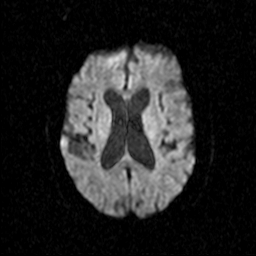
[im 36/45]
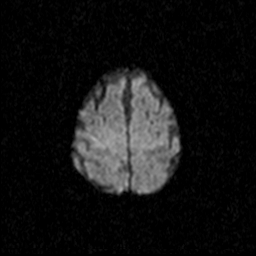
[im 45/45]
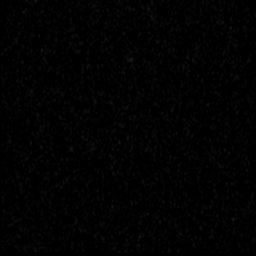

[Series 7: DWI · coronal · 5.0mm · 1.80mm/px · 5 of 41 slices shown (3 of 4)]
[im 1/41]
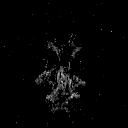
[im 11/41]
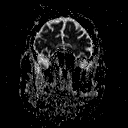
[im 21/41]
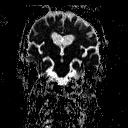
[im 31/41]
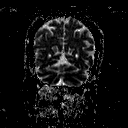
[im 41/41]
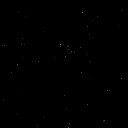

[Series 8: DWI · coronal · 5.0mm · 1.80mm/px · 5 of 38 slices shown (4 of 4)]
[im 1/38]
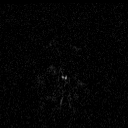
[im 10/38]
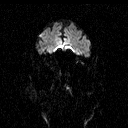
[im 19/38]
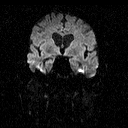
[im 28/38]
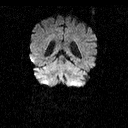
[im 38/38]
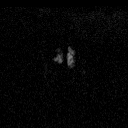

[Series 9: T2 · axial · 5.0mm · 0.45mm/px · z∈[-49,+119]mm · 4 of 27 slices shown (1 of 3)]
[im 1/27]
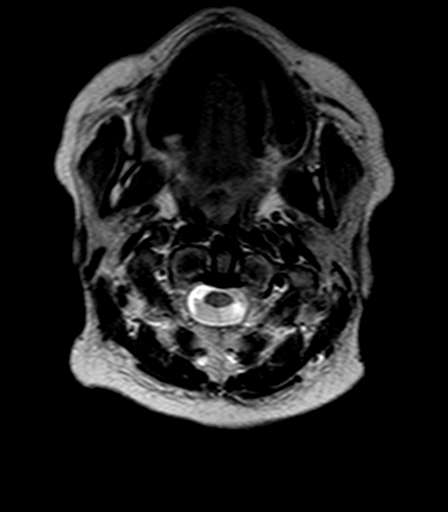
[im 9/27]
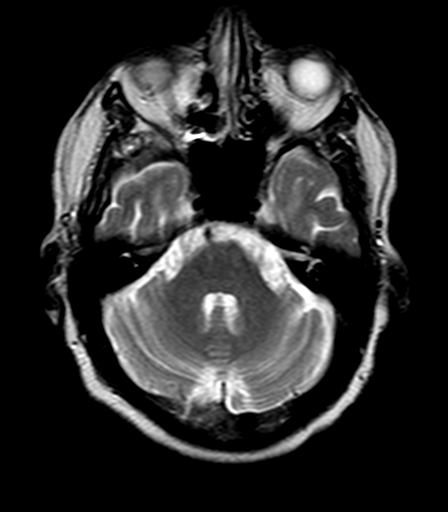
[im 18/27]
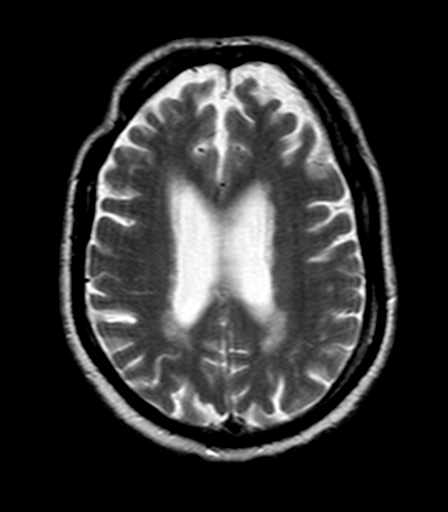
[im 27/27]
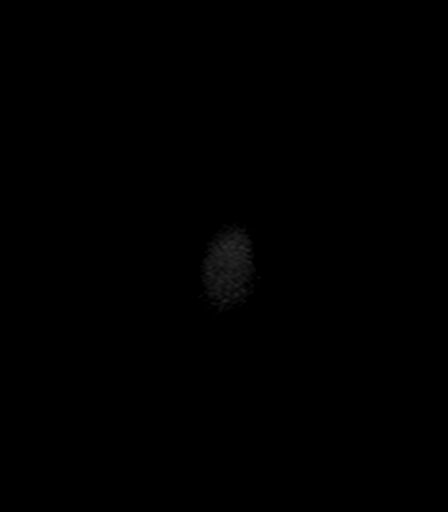

[Series 10: FLAIR · axial · 5.0mm · 0.90mm/px · z∈[-50,+118]mm · 4 of 27 slices shown]
[im 1/27]
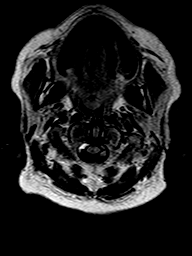
[im 9/27]
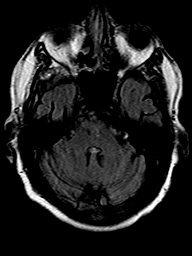
[im 18/27]
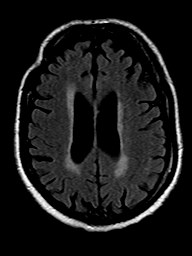
[im 27/27]
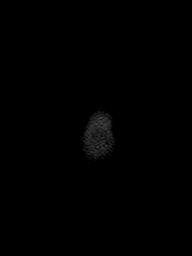

[Series 11: T2 · axial · 5.0mm · 0.45mm/px · z∈[-49,+119]mm · 4 of 27 slices shown (2 of 3)]
[im 1/27]
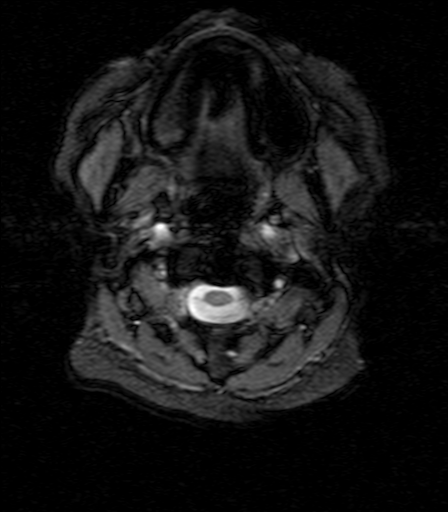
[im 9/27]
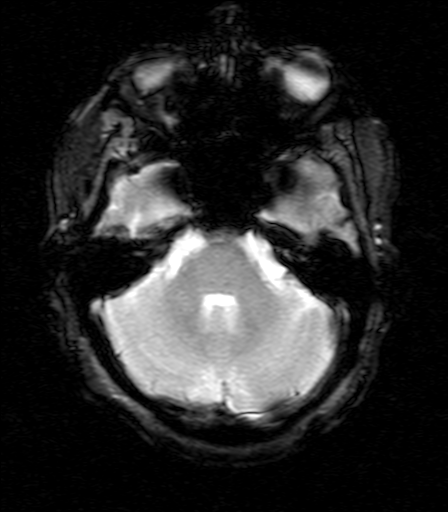
[im 18/27]
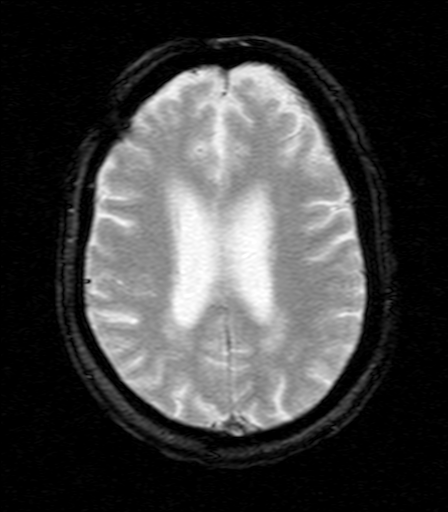
[im 27/27]
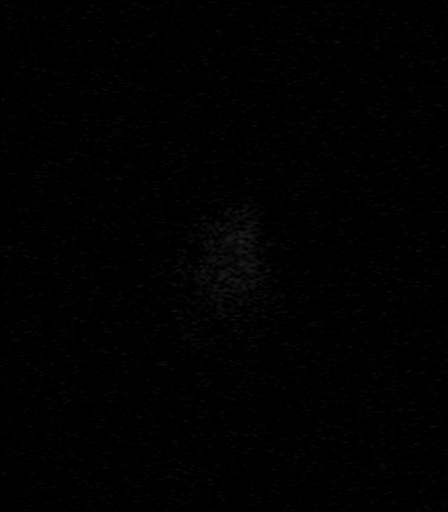

[Series 12: T1 · axial · 3.0mm · 0.45mm/px · 1 of 60 slices shown (2 of 2)]
[im 1/60]
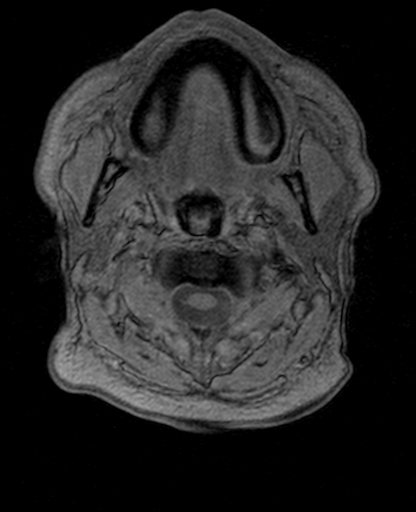

[Series 13: T2 · coronal · 5.0mm · 0.45mm/px · 4 of 31 slices shown (3 of 3)]
[im 1/31]
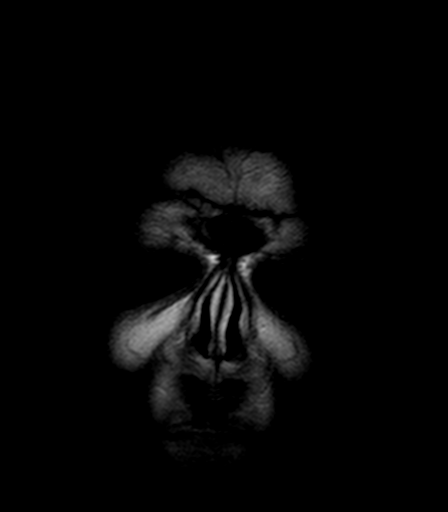
[im 11/31]
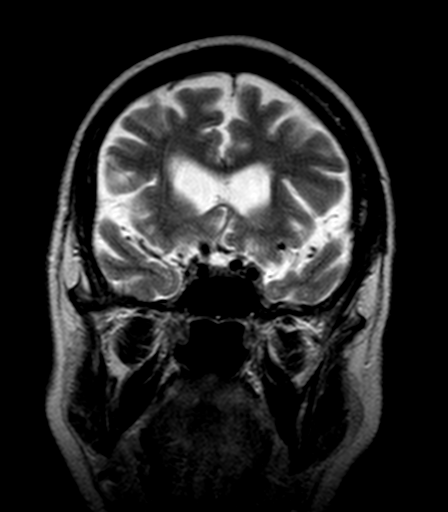
[im 21/31]
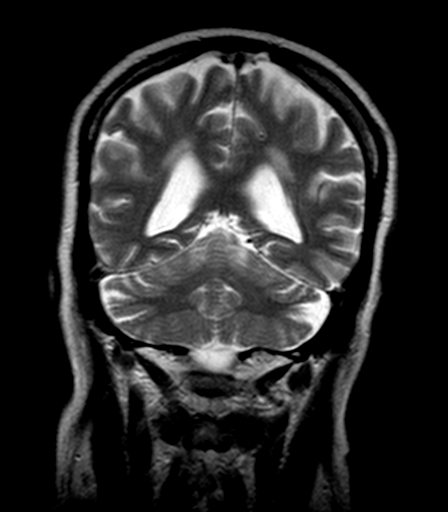
[im 31/31]
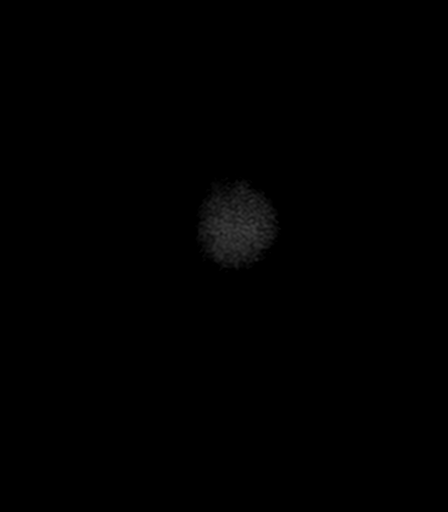

[41 of 48 positions shown; findings below may reference images not displayed]

FINDINGS: Brain: There is no evidence of acute infarct, intracranial
hemorrhage, mass, midline shift, or extra-axial fluid collection.
There is mild cerebral atrophy. Periventricular white matter T2
hyperintensities are similar to the prior MRI and nonspecific but
compatible with mild chronic small vessel ischemic disease.

Vascular: Hypoplastic distal right vertebral artery, more fully
evaluated on earlier CTA. Other major intracranial vascular flow
voids are preserved.

Skull and upper cervical spine: Unremarkable bone marrow signal.

Sinuses/Orbits: Prior right cataract extraction. Postsurgical
changes from prior sinus surgery.

Other: None.
IMPRESSION: 1. No acute intracranial abnormality.
2. Mild chronic small vessel ischemic disease.

## 2018-01-05 DIAGNOSIS — H2512 Age-related nuclear cataract, left eye: Secondary | ICD-10-CM | POA: Diagnosis not present

## 2018-01-05 DIAGNOSIS — E113293 Type 2 diabetes mellitus with mild nonproliferative diabetic retinopathy without macular edema, bilateral: Secondary | ICD-10-CM | POA: Diagnosis not present

## 2018-01-05 DIAGNOSIS — H04123 Dry eye syndrome of bilateral lacrimal glands: Secondary | ICD-10-CM | POA: Diagnosis not present
